# Patient Record
Sex: Female | Born: 1985
Health system: Southern US, Community
[De-identification: ages and names within clinical notes are randomized; demographics above are authoritative.]

---

## 2006-07-22 ENCOUNTER — Ambulatory Visit (HOSPITAL_COMMUNITY): Admission: RE | Admit: 2006-07-22 | Discharge: 2006-07-22 | Payer: Self-pay | Admitting: Obstetrics

## 2013-09-10 ENCOUNTER — Ambulatory Visit (INDEPENDENT_AMBULATORY_CARE_PROVIDER_SITE_OTHER): Payer: BC Managed Care – HMO | Admitting: Family Medicine

## 2013-09-10 VITALS — BP 118/82 | HR 80 | Temp 98.9°F | Resp 20 | Ht 63.5 in | Wt 236.6 lb

## 2013-09-10 DIAGNOSIS — Z111 Encounter for screening for respiratory tuberculosis: Secondary | ICD-10-CM

## 2013-09-10 DIAGNOSIS — Z0000 Encounter for general adult medical examination without abnormal findings: Secondary | ICD-10-CM

## 2013-09-10 DIAGNOSIS — H40059 Ocular hypertension, unspecified eye: Secondary | ICD-10-CM

## 2013-09-10 DIAGNOSIS — E669 Obesity, unspecified: Secondary | ICD-10-CM

## 2013-09-10 DIAGNOSIS — Z23 Encounter for immunization: Secondary | ICD-10-CM

## 2013-09-10 NOTE — Progress Notes (Signed)
[facility] [address], [place] [address] [phone]  Date:  09/10/2013   Name:  Bridget Thomas   DOB:  04-14-1986   MRN:  [id number]  PCP:  No primary provider on file.    Chief Complaint: Annual Exam and PPD testing   History of Present Illness:  Bridget Thomas is a 27 y.o. very pleasant female patient who presents with the following:  She needs a physical today for school.  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  However, she is due for a tetanus shot.  She does not want a flu shot, pap or any labs today Never had a positive PPD  She does use eye drops for increased intraocular pressure- she does not have glaucoma.   LMP less than a month ago  There are no active problems to display for this patient.   History reviewed. No pertinent past medical history.  History reviewed. No pertinent past surgical history.  History  Substance Use Topics  . Smoking status: Never Smoker   . Smokeless tobacco: Not on file  . Alcohol Use: 0.5 oz/week    1 drink(s) per week    Family History  Problem Relation Age of Onset  . Hypertension Mother   . Hypertension Father   . Heart disease Maternal Grandfather   . Heart disease Paternal Grandfather     No Known Allergies  Medication list has been reviewed and updated.  No current outpatient prescriptions on file prior to visit.   No current facility-administered medications on file prior to visit.    Review of Systems:  As per HPI- otherwise negative.   Physical Examination: Filed Vitals:   09/10/13 1057  BP: 118/82  Pulse: 80  Temp: 98.9 F (37.2 C)  Resp: 20   Filed Vitals:   09/10/13 1057  Height: 5' 3.5" (1.613 m)  Weight: 236 lb 9.6 oz (107.321 kg)   Body mass index is 41.25 kg/(m^2). Ideal Body Weight: Weight in (lb) to have BMI = 25: 143.1   GEN: WDWN, NAD, Non-toxic, A & O x 3,  obese HEENT: Atraumatic, Normocephalic. Neck supple. No masses, No LAD. Ears and Nose: No external deformity. CV: RRR, No M/G/R. No JVD. No thrill. No extra heart sounds. PULM: CTA B, no wheezes, crackles, rhonchi. No retractions. No resp. distress. No accessory muscle use. ABD: S, NT, ND. No rebound. No HSM. EXTR: No c/c/e NEURO Normal gait. Normal strength and ROM of all extremities  PSYCH: Normally interactive. Conversant. Not depressed or anxious appearing.  Calm demeanor.   Assessment and Plan: Physical exam - Plan: Tdap vaccine greater than or equal to 7yo IM  Screening-pulmonary TB - Plan: TB Skin Test  PE for work today.  Tdap, PPD.  declined other screening services at this time.  Recommended a flu shot  Signed Abbe Amsterdam, MD

## 2013-09-10 NOTE — Patient Instructions (Signed)
Your tetanus shot is good for 10 years.  You received the TDAP today Please come back for your TB test reading as directed

## 2013-09-12 ENCOUNTER — Encounter (INDEPENDENT_AMBULATORY_CARE_PROVIDER_SITE_OTHER): Payer: BC Managed Care – HMO

## 2013-09-12 DIAGNOSIS — Z111 Encounter for screening for respiratory tuberculosis: Secondary | ICD-10-CM

## 2013-11-13 ENCOUNTER — Ambulatory Visit: Payer: BC Managed Care – HMO | Admitting: Emergency Medicine

## 2013-11-13 ENCOUNTER — Ambulatory Visit: Payer: BC Managed Care – HMO

## 2013-11-13 VITALS — BP 124/66 | HR 103 | Temp 99.1°F | Resp 18 | Ht 63.5 in | Wt 239.6 lb

## 2013-11-13 DIAGNOSIS — R05 Cough: Secondary | ICD-10-CM

## 2013-11-13 DIAGNOSIS — R059 Cough, unspecified: Secondary | ICD-10-CM

## 2013-11-13 DIAGNOSIS — J209 Acute bronchitis, unspecified: Secondary | ICD-10-CM

## 2013-11-13 LAB — POCT CBC
Granulocyte percent: 57.8 %G (ref 37–80)
Hemoglobin: 12.8 g/dL (ref 12.2–16.2)
MCH, POC: 30.6 pg (ref 27–31.2)
MID (cbc): 0.6 (ref 0–0.9)
MPV: 7.6 fL (ref 0–99.8)
POC MID %: 7.9 %M (ref 0–12)
Platelet Count, POC: 387 10*3/uL (ref 142–424)
RBC: 4.18 M/uL (ref 4.04–5.48)
WBC: 8 10*3/uL (ref 4.6–10.2)

## 2013-11-13 MED ORDER — AZITHROMYCIN 250 MG PO TABS: ORAL_TABLET | ORAL | Status: DC

## 2013-11-13 MED ORDER — HYDROCOD POLST-CHLORPHEN POLST 10-8 MG/5ML PO LQCR: 5.0000 mL | Freq: Two times a day (BID) | ORAL | Status: DC | PRN

## 2013-11-13 NOTE — Patient Instructions (Signed)

## 2013-11-13 NOTE — Progress Notes (Signed)
[facility] [address] [place] [address] [phone]  Date:  11/13/2013   Name:  Bridget Thomas   DOB:  Mar 10, 1986   MRN:  [id number]  PCP:  No primary provider on file.    Chief Complaint: Cough and Abdominal Pain   History of Present Illness:  Bridget Thomas is a 27 y.o. very pleasant female patient who presents with the following:  Ill two weeks with upper respiratory infection.  Has nasal congestion and watery drainage.  Worsened.  Now has cough that is largely not productive, mucopurulent nasal drainage, scratchy throat.  Nauseated and vomited once today.  Feverish no documented fever.  Now chilled.  No headache or rash.  No wheezing or shortness of breath.  No stool change.  No improvement with over the counter medications or other home remedies. Denies other complaint or health concern today.   Patient Active Problem List   Diagnosis Date Noted  . Obesity, unspecified 09/10/2013  . Increased intraocular pressure 09/10/2013    History reviewed. No pertinent past medical history.  History reviewed. No pertinent past surgical history.  History  Substance Use Topics  . Smoking status: Never Smoker   . Smokeless tobacco: Not on file  . Alcohol Use: 0.5 oz/week    1 drink(s) per week    Family History  Problem Relation Age of Onset  . Hypertension Mother   . Hypertension Father   . Heart disease Maternal Grandfather   . Heart disease Paternal Grandfather     No Known Allergies  Medication list has been reviewed and updated.  Current Outpatient Prescriptions on File Prior to Visit  Medication Sig Dispense Refill  . APAP-Isometheptene-Dichloral (ISO-ACETAZONE PO) Place 5 % into the left eye.       No current facility-administered medications on file prior to visit.    Review of Systems:  As per HPI, otherwise negative.    Physical Examination: Filed Vitals:   11/13/13 1427  BP: 124/66  Pulse: 103  Temp: 99.1 F (37.3  C)  Resp: 18   Filed Vitals:   11/13/13 1427  Height: 5' 3.5" (1.613 m)  Weight: 239 lb 9.6 oz (108.682 kg)   Body mass index is 41.77 kg/(m^2). Ideal Body Weight: Weight in (lb) to have BMI = 25: 143.1  GEN: obese, NAD, Non-toxic, A & O x 3 HEENT: Atraumatic, Normocephalic. Neck supple. No masses, No LAD. Ears and Nose: No external deformity. CV: RRR, No M/G/R. No JVD. No thrill. No extra heart sounds. PULM: CTA B, no wheezes, crackles, rhonchi. No retractions. No resp. distress. No accessory muscle use. ABD: S, NT, ND, +BS. No rebound. No HSM. EXTR: No c/c/e NEURO Normal gait.  PSYCH: Normally interactive. Conversant. Not depressed or anxious appearing.  Calm demeanor.    Assessment and Plan: Bronchitis zpak tussionex  Signed,  Phillips Odor, MD  [facility] reading (PRIMARY) by  Dr. Dareen Piano.  Negative chest.    Results for orders placed in visit on 11/13/13  POCT CBC      Result Value Range   WBC 8.0  4.6 - 10.2 K/uL   Lymph, poc 2.7  0.6 - 3.4   POC LYMPH PERCENT 34.3  10 - 50 %L   MID (cbc) 0.6  0 - 0.9   POC MID % 7.9  0 - 12 %M   POC Granulocyte 4.6  2 - 6.9   Granulocyte percent 57.8  37 - 80 %G   RBC 4.18  4.04 - 5.48 M/uL   Hemoglobin 12.8  12.2 - 16.2 g/dL   HCT, POC 21.3  08.6 - 47.9 %   MCV 97.7 (*) 80 - 97 fL   MCH, POC 30.6  27 - 31.2 pg   MCHC 31.4 (*) 31.8 - 35.4 g/dL   RDW, POC 57.8     Platelet Count, POC 387  142 - 424 K/uL   MPV 7.6  0 - 99.8 fL

## 2013-11-15 ENCOUNTER — Telehealth: Payer: Self-pay

## 2013-11-15 NOTE — Telephone Encounter (Signed)
Patient called and is wanting to know if she can get a note for being out of work.  She is still really sick and has not been able to go back to work yet. Please call her.

## 2013-11-16 ENCOUNTER — Telehealth: Payer: Self-pay

## 2013-11-16 NOTE — Telephone Encounter (Signed)
Done and in drawer. Pt aware.

## 2013-11-16 NOTE — Telephone Encounter (Signed)
Called pt and she stated that she is improving since her OV, but she hasn't been able to keep food down very well and is still feeling very weak. She reported that she has been able to keep Gatorade down and has been drinking that in order to remain hydrated. Pt requests OOW note from Tues afternoon through Friday (today), to go back to work tomorrow. Dr Dareen Piano, is this OK?

## 2013-11-16 NOTE — Telephone Encounter (Signed)
Fine

## 2013-11-30 NOTE — Telephone Encounter (Signed)
No message

## 2015-03-03 ENCOUNTER — Emergency Department (INDEPENDENT_AMBULATORY_CARE_PROVIDER_SITE_OTHER)
Admission: EM | Admit: 2015-03-03 | Discharge: 2015-03-03 | Disposition: A | Discharge status: Home / Self Care | Payer: BLUE CROSS/BLUE SHIELD | Source: Home / Self Care | Attending: Emergency Medicine | Admitting: Emergency Medicine

## 2015-03-03 ENCOUNTER — Encounter (HOSPITAL_COMMUNITY): Payer: Self-pay | Admitting: Emergency Medicine

## 2015-03-03 DIAGNOSIS — J069 Acute upper respiratory infection, unspecified: Secondary | ICD-10-CM

## 2015-03-03 LAB — POCT RAPID STREP A: STREPTOCOCCUS, GROUP A SCREEN (DIRECT): NEGATIVE

## 2015-03-03 MED ORDER — IPRATROPIUM BROMIDE 0.06 % NA SOLN: 2.0000 | Freq: Four times a day (QID) | NASAL | Status: DC

## 2015-03-03 MED ORDER — NAPROXEN 500 MG PO TABS: 500.0000 mg | ORAL_TABLET | Freq: Two times a day (BID) | ORAL | Status: DC

## 2015-03-03 MED ORDER — BENZONATATE 200 MG PO CAPS: 200.0000 mg | ORAL_CAPSULE | Freq: Three times a day (TID) | ORAL | Status: DC | PRN

## 2015-03-03 NOTE — Discharge Instructions (Signed)

## 2015-03-03 NOTE — ED Notes (Signed)
See provider's note

## 2015-03-03 NOTE — ED Provider Notes (Signed)
   Chief Complaint   Sore Throat   History of Present Illness   Bridget Thomas is a 29 year old female who has had a three-day history of sore throat, burning throat, nasal congestion with clear to yellow, bloody rhinorrhea, headache, sinus pressure, ear congestion, dry cough, chills, and fatigue. She denies any fever or chills. She works as a Lawyersubstitute teacher.  Review of Systems   Other than as noted above, the patient denies any of the following symptoms: Systemic:  No fevers, chills, sweats, or myalgias. Eye:  No redness or discharge. ENT:  No ear pain, headache, nasal congestion, drainage, sinus pressure, or sore throat. Neck:  No neck pain, stiffness, or swollen glands. Lungs:  No cough, sputum production, hemoptysis, wheezing, chest tightness, shortness of breath or chest pain. GI:  No abdominal pain, nausea, vomiting or diarrhea.  PMFSH   Past medical history, family history, social history, meds, and allergies were reviewed. She takes eyedrops for glaucoma.  Physical exam   Vital signs:  BP 132/88 mmHg  Pulse 82  Temp(Src) 99 F (37.2 C) (Oral)  Resp 16  SpO2 98% General:  Alert and oriented.  In no distress.  Skin warm and dry. Eye:  No conjunctival injection or drainage. Lids were normal. ENT:  TMs and canals were normal, without erythema or inflammation.  Nasal mucosa was clear and uncongested, without drainage.  Mucous membranes were moist.  Pharynx was clear with no exudate or drainage.  There were no oral ulcerations or lesions. Neck:  Supple, no adenopathy, tenderness or mass. Lungs:  No respiratory distress.  Lungs were clear to auscultation, without wheezes, rales or rhonchi.  Breath sounds were clear and equal bilaterally.  Heart:  Regular rhythm, without gallops, murmers or rubs. Skin:  Clear, warm, and dry, without rash or lesions.  Labs   Results for orders placed or performed during the hospital encounter of 03/03/15  POCT rapid strep A [facility]  Result Value Ref Range   Streptococcus, Group A Screen (Direct) NEGATIVE NEGATIVE    Assessment     The encounter diagnosis was Viral URI.  There is no evidence of pneumonia, strep throat, sinusitis, otitis media.    Plan    1.  Meds:  The following meds were prescribed:   New Prescriptions   BENZONATATE (TESSALON) 200 MG CAPSULE    Take 1 capsule (200 mg total) by mouth 3 (three) times daily as needed for cough.   IPRATROPIUM (ATROVENT) 0.06 % NASAL SPRAY    Place 2 sprays into both nostrils 4 (four) times daily.   NAPROXEN (NAPROSYN) 500 MG TABLET    Take 1 tablet (500 mg total) by mouth 2 (two) times daily.    2.  Patient Education/Counseling:  The patient was given appropriate handouts, self care instructions, and instructed in symptomatic relief.  Instructed to get extra fluids and extra rest.    3.  Follow up:  The patient was told to follow up here if no better in 3 to 4 days, or sooner if becoming worse in any way, and given some red flag symptoms such as increasing fever, difficulty breathing, chest pain, or persistent vomiting which would prompt immediate return.       Reuben Likesavid C Lyman Balingit, MD 03/03/15 [phone]

## 2015-03-06 LAB — CULTURE, GROUP A STREP: STREP A CULTURE: NEGATIVE

## 2016-06-22 DIAGNOSIS — R102 Pelvic and perineal pain: Secondary | ICD-10-CM | POA: Diagnosis not present

## 2016-06-22 DIAGNOSIS — N944 Primary dysmenorrhea: Secondary | ICD-10-CM | POA: Diagnosis not present

## 2016-07-13 ENCOUNTER — Ambulatory Visit (INDEPENDENT_AMBULATORY_CARE_PROVIDER_SITE_OTHER): Payer: BLUE CROSS/BLUE SHIELD | Admitting: Physician Assistant

## 2016-07-13 VITALS — BP 122/72 | HR 83 | Temp 98.8°F | Resp 17 | Ht 63.5 in | Wt 238.0 lb

## 2016-07-13 DIAGNOSIS — J069 Acute upper respiratory infection, unspecified: Secondary | ICD-10-CM | POA: Diagnosis not present

## 2016-07-13 NOTE — Patient Instructions (Signed)
Take Zytec-D, one in the morning and one at night.  Take Afrin Nasal spray in the evening only for no more than three days. Please take today and tomorrow so you can rest.

## 2016-07-13 NOTE — Progress Notes (Signed)
   07/13/2016 12:41 PM   DOB: 05-07-1986 / MRN: [id number]  SUBJECTIVE:  Bridget Thomas is a 30 y.o. female presenting for nasal congestion.  This was preceded by a sore throat that started 4 days ago and resolved on its own.  She denies cough, fever, chills, nausea, SOB, DOE.  She is no worse or better with the exception of the sore throat. She has been taking tylenol. She would like a note for work.   She has No Known Allergies.   She  has no past medical history on file.    She  reports that she has never smoked. She does not have any smokeless tobacco history on file. She reports that she drinks about 0.5 oz of alcohol per week. She reports that she does not use illicit drugs. She  has no sexual activity history on file. The patient  has no past surgical history on file.  Her family history includes Heart disease in her maternal grandfather and paternal grandfather; Hypertension in her father and mother.  Review of Systems  Constitutional: Negative for fever and chills.  Respiratory: Negative for cough.   Cardiovascular: Negative for chest pain.  Musculoskeletal: Negative for myalgias.  Neurological: Negative for dizziness and headaches.    Problem list and medications reviewed and updated by myself where necessary, and exist elsewhere in the encounter.   OBJECTIVE:  BP 122/72 mmHg  Pulse 83  Temp(Src) 98.8 F (37.1 C) (Oral)  Resp 17  Ht 5' 3.5" (1.613 m)  Wt 238 lb (107.956 kg)  BMI 41.49 kg/m2  SpO2 98%  LMP 06/15/2016  Physical Exam  Constitutional: She is oriented to person, place, and time.  HENT:  Right Ear: External ear normal.  Left Ear: External ear normal.  Nose: Mucosal edema present. Right sinus exhibits no maxillary sinus tenderness and no frontal sinus tenderness. Left sinus exhibits no maxillary sinus tenderness and no frontal sinus tenderness.  Mouth/Throat: Oropharynx is clear and moist. No oropharyngeal exudate.  Eyes: Conjunctivae are normal. Pupils  are equal, round, and reactive to light.  Cardiovascular: Regular rhythm and normal heart sounds.   Pulmonary/Chest: Effort normal and breath sounds normal. She has no rales.  Neurological: She is alert and oriented to person, place, and time.  Skin: Skin is warm and dry. No rash noted. She is not diaphoretic. No erythema.  Psychiatric: Her behavior is normal.    No results found for this or any previous visit (from the past 72 hour(s)).  No results found.  ASSESSMENT AND PLAN  [name] was seen today for sinusitis and nasal congestion.  Diagnoses and all orders for this visit:  Viral URI: Patient reassured.  Zyrtec-D and afrin for three days at night only.     The patient was advised to call or return to clinic if she does not see an improvement in symptoms, or to seek the care of the closest emergency department if she worsens with the above plan.   Deliah BostonMichael Sherelle Castelli, MHS, PA-C [facility] 07/13/2016 12:41 PM

## 2016-07-26 DIAGNOSIS — H401131 Primary open-angle glaucoma, bilateral, mild stage: Secondary | ICD-10-CM | POA: Diagnosis not present

## 2016-09-15 DIAGNOSIS — N944 Primary dysmenorrhea: Secondary | ICD-10-CM | POA: Diagnosis not present

## 2016-09-15 DIAGNOSIS — Z1151 Encounter for screening for human papillomavirus (HPV): Secondary | ICD-10-CM | POA: Diagnosis not present

## 2016-09-15 DIAGNOSIS — Z130 Encounter for screening for diseases of the blood and blood-forming organs and certain disorders involving the immune mechanism: Secondary | ICD-10-CM | POA: Diagnosis not present

## 2016-09-15 DIAGNOSIS — Z13220 Encounter for screening for lipoid disorders: Secondary | ICD-10-CM | POA: Diagnosis not present

## 2016-09-15 DIAGNOSIS — N898 Other specified noninflammatory disorders of vagina: Secondary | ICD-10-CM | POA: Diagnosis not present

## 2016-09-15 DIAGNOSIS — Z01419 Encounter for gynecological examination (general) (routine) without abnormal findings: Secondary | ICD-10-CM | POA: Diagnosis not present

## 2016-09-15 DIAGNOSIS — Z1329 Encounter for screening for other suspected endocrine disorder: Secondary | ICD-10-CM | POA: Diagnosis not present

## 2016-09-15 DIAGNOSIS — Z0000 Encounter for general adult medical examination without abnormal findings: Secondary | ICD-10-CM | POA: Diagnosis not present

## 2016-09-15 DIAGNOSIS — R87810 Cervical high risk human papillomavirus (HPV) DNA test positive: Secondary | ICD-10-CM | POA: Diagnosis not present

## 2016-09-15 DIAGNOSIS — Z131 Encounter for screening for diabetes mellitus: Secondary | ICD-10-CM | POA: Diagnosis not present

## 2016-09-17 DIAGNOSIS — L304 Erythema intertrigo: Secondary | ICD-10-CM | POA: Diagnosis not present

## 2016-09-17 DIAGNOSIS — L309 Dermatitis, unspecified: Secondary | ICD-10-CM | POA: Diagnosis not present

## 2016-09-17 DIAGNOSIS — L906 Striae atrophicae: Secondary | ICD-10-CM | POA: Diagnosis not present

## 2016-12-09 DIAGNOSIS — E559 Vitamin D deficiency, unspecified: Secondary | ICD-10-CM | POA: Diagnosis not present

## 2017-01-21 ENCOUNTER — Ambulatory Visit (INDEPENDENT_AMBULATORY_CARE_PROVIDER_SITE_OTHER): Payer: BLUE CROSS/BLUE SHIELD | Admitting: Physician Assistant

## 2017-01-21 VITALS — BP 120/76 | HR 76 | Temp 98.7°F | Resp 18 | Ht 63.5 in | Wt 245.0 lb

## 2017-01-21 DIAGNOSIS — J3489 Other specified disorders of nose and nasal sinuses: Secondary | ICD-10-CM | POA: Diagnosis not present

## 2017-01-21 DIAGNOSIS — R6889 Other general symptoms and signs: Secondary | ICD-10-CM | POA: Diagnosis not present

## 2017-01-21 MED ORDER — IPRATROPIUM BROMIDE 0.03 % NA SOLN: 2.0000 | Freq: Two times a day (BID) | NASAL | 0 refills | Status: DC

## 2017-01-21 MED ORDER — OSELTAMIVIR PHOSPHATE 75 MG PO CAPS: 75.0000 mg | ORAL_CAPSULE | Freq: Two times a day (BID) | ORAL | 0 refills | Status: DC

## 2017-01-21 NOTE — Progress Notes (Signed)
[facility] [address], [place] [id number] [phone]  Date:  01/21/2017   Name:  Bridget Thomas   DOB:  06/04/1986   MRN:  [id number]  PCP:  Duane LopeAlan Ross, MD    History of Present Illness:  Bridget Thomas is a 31 y.o. female patient who [facility] to [facility] cc of nasal congestion, cp, and facial pain.    Chief Complaint  Patient presents with  . Nasal Congestion  . Chest Pain    TIGHTNESS AND CONGESTION   . Nasal Congestion    RUNNY NOSE  . Facial Pain    TIGHTNESS   2 days ago, in the evening, she started to have burning throat pain.  She has congestion and headache.  Chills.  Tight in the face.  Ears are popping.  Runny nose.  Minimal coughing.  Chest feels tight.  No sob or dyspnea.  She feels very fatigued.  Yellow and white mucus from the nose when she blows.  Subjective fever and chills. Frontal sinus and cheeks feel very "tight".   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 She has done mucinex and Theraflu--they did not help much.   No flu vaccine this year.   Chest feels tight.  Coughing has started but minimal.     Patient Active Problem List   Diagnosis Date Noted  . Obesity, unspecified 09/10/2013  . Increased intraocular pressure 09/10/2013    History reviewed. No pertinent past medical history.  History reviewed. No pertinent surgical history.  Social History  Substance Use Topics  . Smoking status: Never Smoker  . Smokeless tobacco: Never Used  . Alcohol use 0.5 oz/week    1 Standard drinks or equivalent per week    Family History  Problem Relation Age of Onset  . Hypertension Mother   . Hypertension Father   . Heart disease Maternal Grandfather   . Heart disease Paternal Grandfather     No Known Allergies  Medication list has been reviewed and updated.  Current Outpatient Prescriptions on File Prior to Visit  Medication Sig Dispense Refill  . acetaminophen  (TYLENOL) 325 MG tablet Take 650 mg by mouth every 6 (six) hours as needed.    Marland Kitchen. APAP-Isometheptene-Dichloral (ISO-ACETAZONE PO) Place 5 % into the left eye.    . cetirizine (ZYRTEC) 10 MG tablet Take 10 mg by mouth daily.     No current facility-administered medications on file prior to visit.     ROS ROS otherwise unremarkable unless listed above.   Physical Examination: BP 120/76 (BP Location: Right Arm, Patient Position: Sitting, Cuff Size: Small)   Pulse 76   Temp 98.7 F (37.1 C) (Oral)   Resp 18   Ht 5' 3.5" (1.613 m)   Wt 245 lb (111.1 kg)   LMP 01/13/2017 (Exact Date)   SpO2 99%   BMI 42.72 kg/m  Ideal Body Weight: Weight in (lb) to have BMI = 25: 143.1  Physical Exam  Constitutional: She is oriented to person, place, and time. She appears well-developed and well-nourished. No distress.  HENT:  Head: Normocephalic and atraumatic.  Right Ear: Tympanic membrane, external ear and ear canal normal.  Left Ear: Tympanic membrane, external ear and ear canal normal.  Nose: Mucosal edema and rhinorrhea present. Right sinus exhibits maxillary sinus tenderness and frontal sinus tenderness. Left sinus exhibits maxillary sinus tenderness and frontal sinus tenderness.  Mouth/Throat: No uvula  swelling. No oropharyngeal exudate, posterior oropharyngeal edema or posterior oropharyngeal erythema.  Eyes: Conjunctivae and EOM are normal. Pupils are equal, round, and reactive to light.  Cardiovascular: Normal rate and regular rhythm.  Exam reveals no gallop, no distant heart sounds and no friction rub.   No murmur heard. Pulmonary/Chest: Effort normal. No respiratory distress. She has no decreased breath sounds. She has no wheezes. She has no rhonchi.  Lymphadenopathy:       Head (right side): No submandibular, no tonsillar, no preauricular and no posterior auricular adenopathy present.       Head (left side): No submandibular, no tonsillar, no preauricular and no posterior auricular  adenopathy present.  Neurological: She is alert and oriented to person, place, and time.  Skin: She is not diaphoretic.  Psychiatric: She has a normal mood and affect. Her behavior is normal.     Assessment and Plan: Bridget Thomas is a 31 y.o. female who is here today for cc of sinus pain, congestion, tightness, and fever.  Fine to write note, for patient if she needs for Monday.  Possible flu, possible rhinosinusitis.  We will start treatment for possible flu.  Advised that this may not resolve a viral sinus infection.  She would like to treat for possible flu.  Advised tylenol or ibuprofen for pain.    Flu-like symptoms  Sinus pain - Plan: oseltamivir (TAMIFLU) 75 MG capsule, ipratropium (ATROVENT) 0.03 % nasal spray  Trena Platt, PA-C [facility] 1/28/20187:34 PM

## 2017-01-21 NOTE — Patient Instructions (Addendum)
Continue to take the mucinex.  Make sure you are drinking at least 64 oz of water per day. Use tylenol or ibuprofen for pain and fever.  You can take 600mg  of ibuprofen every 6 hours. Take with food.   Take medications as prescribed.   IF you received an x-ray today, you will receive an invoice from [facility]. Please contact [facility] at [phone] with questions or concerns regarding your invoice.   IF you received labwork today, you will receive an invoice from [facility]. Please contact [facility] at [phone] with questions or concerns regarding your invoice.   Our billing staff will not be able to assist you with questions regarding bills from these companies.  You will be contacted with the lab results as soon as they are available. The fastest way to get your results is to activate your My Chart account. Instructions are located on the last page of this paperwork. If you have not heard from us regarding the results in 2 weeks, please contact this office.

## 2017-01-22 ENCOUNTER — Telehealth: Payer: Self-pay

## 2017-01-22 NOTE — Telephone Encounter (Signed)
That is fine to write a note.  If she does not feel improvement by Tuesday, then she can return or call as long as she has no trouble breathing, sob, or dizziness.

## 2017-01-22 NOTE — Telephone Encounter (Signed)
Pt saw stephanie on Friday and was told if she needed a note for Monday to let us know -pt states she should be a note in chart so patient is needing the note being out Monday and return on Tuesday and would like to know if she does not feel better should she come back in   Best number [phone]

## 2017-01-22 NOTE — Telephone Encounter (Signed)
Okay to provide work note for Monday?

## 2017-01-24 ENCOUNTER — Ambulatory Visit (INDEPENDENT_AMBULATORY_CARE_PROVIDER_SITE_OTHER): Payer: BLUE CROSS/BLUE SHIELD | Admitting: Family Medicine

## 2017-01-24 VITALS — BP 118/82 | HR 98 | Temp 98.5°F | Resp 18 | Ht 63.5 in | Wt 244.0 lb

## 2017-01-24 DIAGNOSIS — J069 Acute upper respiratory infection, unspecified: Secondary | ICD-10-CM | POA: Diagnosis not present

## 2017-01-24 DIAGNOSIS — J9801 Acute bronchospasm: Secondary | ICD-10-CM

## 2017-01-24 MED ORDER — ALBUTEROL SULFATE HFA 108 (90 BASE) MCG/ACT IN AERS: 2.0000 | INHALATION_SPRAY | RESPIRATORY_TRACT | 0 refills | Status: DC | PRN

## 2017-01-24 MED ORDER — HYDROCODONE-HOMATROPINE 5-1.5 MG/5ML PO SYRP: 5.0000 mL | ORAL_SOLUTION | Freq: Three times a day (TID) | ORAL | 0 refills | Status: DC | PRN

## 2017-01-24 NOTE — Telephone Encounter (Signed)
Letter printed.

## 2017-01-24 NOTE — Progress Notes (Signed)
  Chief Complaint  Patient presents with  . Cough  . Chest Pain    Congestion   . Follow-up    recheck sinus infection     HPI  Onset of symptoms 14 ago with progressive symptoms that include there isnt history of asthma There are no sick contacts  Pt reports that she completed the course of tamiflu She reports that she continues to have a dry raspy cough She denies fevers or chills She reports that she coughs to the point of fatigue She works as a Education officer, environmentalpersonal banker No diarrhea or vomiting  History reviewed. No pertinent past medical history.  Current Outpatient Prescriptions  Medication Sig Dispense Refill  . acetaminophen (TYLENOL) 325 MG tablet Take 650 mg by mouth every 6 (six) hours as needed.    Marland Kitchen. APAP-Isometheptene-Dichloral (ISO-ACETAZONE PO) Place 5 % into the left eye.    . cetirizine (ZYRTEC) 10 MG tablet Take 10 mg by mouth daily.    Marland Kitchen. ipratropium (ATROVENT) 0.03 % nasal spray Place 2 sprays into both nostrils 2 (two) times daily. 30 mL 0  . oseltamivir (TAMIFLU) 75 MG capsule Take 1 capsule (75 mg total) by mouth 2 (two) times daily. 10 capsule 0  . albuterol (PROVENTIL HFA;VENTOLIN HFA) 108 (90 Base) MCG/ACT inhaler Inhale 2 puffs into the lungs every 4 (four) hours as needed (cough). 1 Inhaler 0  . HYDROcodone-homatropine (HYCODAN) 5-1.5 MG/5ML syrup Take 5 mLs by mouth every 8 (eight) hours as needed for cough. 120 mL 0   No current facility-administered medications for this visit.     Allergies: No Known Allergies  History reviewed. No pertinent surgical history.  Social History   Social History  . Marital status: Married    Spouse name: N/A  . Number of children: N/A  . Years of education: N/A   Social History Main Topics  . Smoking status: Never Smoker  . Smokeless tobacco: Never Used  . Alcohol use 0.5 oz/week    1 Standard drinks or equivalent per week  . Drug use: No  . Sexual activity: Not Asked   Other Topics Concern  . None   Social  History Narrative  . None    ROS See hpi  Objective: Vitals:   01/24/17 1338  BP: 118/82  Pulse: 98  Resp: 18  Temp: 98.5 F (36.9 C)  TempSrc: Oral  SpO2: 97%  Weight: 244 lb (110.7 kg)  Height: 5' 3.5" (1.613 m)    Physical Exam General: alert, oriented, in NAD Head: normocephalic, atraumatic, no sinus tenderness Eyes: EOM intact, no scleral icterus or conjunctival injection Ears: TM clear bilaterally Throat: no pharyngeal exudate or erythema Lymph: no posterior auricular, submental or cervical lymph adenopathy Heart: normal rate, normal sinus rhythm, no murmurs Lungs: clear to auscultation bilaterally, no wheezing   Assessment and Plan Shantoya was seen today for cough, chest pain and follow-up.  Diagnoses and all orders for this visit:  Acute URI Bronchospasm Other orders Given that pt received Tamiflu Prescribed albuterol for bronchospasm Discussed use of albuterol for the day time cough and hycodan for cough  -     albuterol (PROVENTIL HFA;VENTOLIN HFA) 108 (90 Base) MCG/ACT inhaler; Inhale 2 puffs into the lungs every 4 (four) hours as needed (cough). -     HYDROcodone-homatropine (HYCODAN) 5-1.5 MG/5ML syrup; Take 5 mLs by mouth every 8 (eight) hours as needed for cough.     Cinsere Mizrahi A Earlyn Sylvan

## 2017-01-24 NOTE — Patient Instructions (Addendum)
Bronchospasm, Adult A bronchospasm is a spasm or tightening of the airways going into the lungs. During a bronchospasm breathing becomes more difficult because the airways get smaller. When this happens there can be coughing, a whistling sound when breathing (wheezing), and difficulty breathing. Bronchospasm is often associated with asthma, but not all patients who experience a bronchospasm have asthma. What are the causes? A bronchospasm is caused by inflammation or irritation of the airways. The inflammation or irritation may be triggered by:  Allergies (such as to animals, pollen, food, or mold). Allergens that cause bronchospasm may cause wheezing immediately after exposure or many hours later.  Infection. Viral infections are believed to be the most common cause of bronchospasm.  Exercise.  Irritants (such as pollution, cigarette smoke, strong odors, aerosol sprays, and paint fumes).  Weather changes. Winds increase molds and pollens in the air. Rain refreshes the air by washing irritants out. Cold air may cause inflammation.  Stress and emotional upset. What are the signs or symptoms?  Wheezing.  Excessive nighttime coughing.  Frequent or severe coughing with a simple cold.  Chest tightness.  Shortness of breath. How is this diagnosed? Bronchospasm is usually diagnosed through a history and physical exam. Tests, such as chest X-rays, are sometimes done to look for other conditions. How is this treated?  Inhaled medicines can be given to open up your airways and help you breathe. The medicines can be given using either an inhaler or a nebulizer machine.  Corticosteroid medicines may be given for severe bronchospasm, usually when it is associated with asthma. Follow these instructions at home:  Always have a plan prepared for seeking medical care. Know when to call your health care provider and local emergency services (911 in the [place] Know where you can access local  emergency care.  Only take medicines as directed by your health care provider.  If you were prescribed an inhaler or nebulizer machine, ask your health care provider to explain how to use it correctly. Always use a spacer with your inhaler if you were given one.  It is necessary to remain calm during an attack. Try to relax and breathe more slowly.  Control your home environment in the following ways:  Change your heating and air conditioning filter at least once a month.  Limit your use of fireplaces and wood stoves.  Do not smoke and do not allow smoking in your home.  Avoid exposure to perfumes and fragrances.  Get rid of pests (such as roaches and mice) and their droppings.  Throw away plants if you see mold on them.  Keep your house clean and dust free.  Replace carpet with wood, tile, or vinyl flooring. Carpet can trap dander and dust.  Use allergy-proof pillows, mattress covers, and box spring covers.  Wash bed sheets and blankets every week in hot water and dry them in a dryer.  Use blankets that are made of polyester or cotton.  Wash hands frequently. Contact a health care provider if:  You have muscle aches.  You have chest pain.  The sputum changes from clear or white to yellow, green, gray, or bloody.  The sputum you cough up gets thicker.  There are problems that may be related to the medicine you are given, such as a rash, itching, swelling, or trouble breathing. Get help right away if:  You have worsening wheezing and coughing even after taking your prescribed medicines.  You have increased difficulty breathing.  You develop severe chest pain. This   information is not intended to replace advice given to you by your health care provider. Make sure you discuss any questions you have with your health care provider. Document Released: 12/16/2003 Document Revised: 05/20/2016 Document Reviewed: 06/04/2013 Elsevier Interactive Patient Education  2017  Elsevier Inc.  

## 2017-04-04 DIAGNOSIS — H401131 Primary open-angle glaucoma, bilateral, mild stage: Secondary | ICD-10-CM | POA: Diagnosis not present

## 2017-08-24 DIAGNOSIS — H401131 Primary open-angle glaucoma, bilateral, mild stage: Secondary | ICD-10-CM | POA: Diagnosis not present

## 2017-09-21 DIAGNOSIS — Z114 Encounter for screening for human immunodeficiency virus [HIV]: Secondary | ICD-10-CM | POA: Diagnosis not present

## 2017-09-21 DIAGNOSIS — Z1151 Encounter for screening for human papillomavirus (HPV): Secondary | ICD-10-CM | POA: Diagnosis not present

## 2017-09-21 DIAGNOSIS — Z6841 Body Mass Index (BMI) 40.0 and over, adult: Secondary | ICD-10-CM | POA: Diagnosis not present

## 2017-09-21 DIAGNOSIS — Z113 Encounter for screening for infections with a predominantly sexual mode of transmission: Secondary | ICD-10-CM | POA: Diagnosis not present

## 2017-09-21 DIAGNOSIS — Z01419 Encounter for gynecological examination (general) (routine) without abnormal findings: Secondary | ICD-10-CM | POA: Diagnosis not present

## 2017-09-21 DIAGNOSIS — Z1159 Encounter for screening for other viral diseases: Secondary | ICD-10-CM | POA: Diagnosis not present

## 2017-09-21 DIAGNOSIS — R87810 Cervical high risk human papillomavirus (HPV) DNA test positive: Secondary | ICD-10-CM | POA: Diagnosis not present

## 2017-09-23 ENCOUNTER — Encounter: Payer: Self-pay | Admitting: Family Medicine

## 2017-09-23 ENCOUNTER — Ambulatory Visit (INDEPENDENT_AMBULATORY_CARE_PROVIDER_SITE_OTHER): Payer: BLUE CROSS/BLUE SHIELD | Admitting: Family Medicine

## 2017-09-23 VITALS — BP 110/68 | HR 82 | Temp 98.6°F | Resp 16 | Ht 63.5 in | Wt 243.4 lb

## 2017-09-23 DIAGNOSIS — J302 Other seasonal allergic rhinitis: Secondary | ICD-10-CM | POA: Diagnosis not present

## 2017-09-23 MED ORDER — FLUTICASONE PROPIONATE 50 MCG/ACT NA SUSP: 2.0000 | Freq: Every day | NASAL | 6 refills | Status: AC

## 2017-09-23 NOTE — Progress Notes (Signed)
   9/28/20184:45 PM  Bridget Thomas 1986/08/21, 31 y.o. female [id number]  Chief Complaint  Patient presents with  . Sinus Problem    X 1 week  . Fatigue  . Cough    HPI:   Patient is a 31 y.o. female who presents today for 1 week of nasal congestion, sinus pressure, sneezing, minimally productive cough, fatigue. Denies ear pain, sore throat, SOB, wheezing, nausea, vomiting, diarrhea or rashes.   Depression screen [facility] 2/9 09/23/2017 01/24/2017 01/21/2017  Decreased Interest 0 0 0  Down, Depressed, Hopeless 0 0 0  PHQ - 2 Score 0 0 0    No Known Allergies  Current Outpatient Prescriptions on File Prior to Visit  Medication Sig Dispense Refill  . acetaminophen (TYLENOL) 325 MG tablet Take 650 mg by mouth every 6 (six) hours as needed.     No current facility-administered medications on file prior to visit.     History reviewed. No pertinent past medical history.  History reviewed. No pertinent surgical history.  Social History  Substance Use Topics  . Smoking status: Never Smoker  . Smokeless tobacco: Never Used  . Alcohol use 0.5 oz/week    1 Standard drinks or equivalent per week    Family History  Problem Relation Age of Onset  . Hypertension Mother   . Hypertension Father   . Heart disease Maternal Grandfather   . Heart disease Paternal Grandfather     ROS Per HPI  OBJECTIVE:  Blood pressure 110/68, pulse 82, temperature 98.6 F (37 C), temperature source Oral, resp. rate 16, height 5' 3.5" (1.613 m), weight 243 lb 6.4 oz (110.4 kg), SpO2 98 %.  Physical Exam  Constitutional: She is oriented to person, place, and time and well-developed, well-nourished, and in no distress.  HENT:  Head: Normocephalic and atraumatic.  Right Ear: Hearing, tympanic membrane, external ear and ear canal normal.  Left Ear: Hearing, tympanic membrane, external ear and ear canal normal.  Nose: Mucosal edema and rhinorrhea present. Right sinus exhibits no maxillary sinus  tenderness and no frontal sinus tenderness. Left sinus exhibits no maxillary sinus tenderness and no frontal sinus tenderness.  Mouth/Throat: Oropharynx is clear and moist and mucous membranes are normal.  Eyes: Pupils are equal, round, and reactive to light. EOM are normal.  Neck: Neck supple.  Cardiovascular: Normal rate, regular rhythm and normal heart sounds.  Exam reveals no gallop and no friction rub.   No murmur heard. Pulmonary/Chest: Effort normal and breath sounds normal. She has no wheezes. She has no rales.  Lymphadenopathy:    She has no cervical adenopathy.  Neurological: She is alert and oriented to person, place, and time. Gait normal.  Skin: Skin is warm and dry.    ASSESSMENT and PLAN  1. Seasonal allergic rhinitis, unspecified trigger Discussed conservative measures, use of nasal saline washes, OTC oral anti-histamines, rx flonase. Patient educational handout given. RTC precautions given.  - fluticasone (FLONASE) 50 MCG/ACT nasal spray; Place 2 sprays into both nostrils daily.    Return if symptoms worsen or fail to improve.    Myles Lipps, MD Primary Care at [facility] [address] [place], [place] [address] Ph.  [phone] Fax [phone]

## 2017-09-23 NOTE — Patient Instructions (Addendum)
IF you received an x-ray today, you will receive an invoice from [facility]. Please contact [facility] at [phone] with questions or concerns regarding your invoice.   IF you received labwork today, you will receive an invoice from [place]. Please contact [facility] at [phone] with questions or concerns regarding your invoice.   Our billing staff will not be able to assist you with questions regarding bills from these companies.  You will be contacted with the lab results as soon as they are available. The fastest way to get your results is to activate your My Chart account. Instructions are located on the last page of this paperwork. If you have not heard from [place] regarding the results in 2 weeks, please contact this office.     Nasal Allergies Nasal allergies are a reaction to allergens in the air. Allergens are particles in the air that cause your body to have an allergic reaction. Nasal allergies are not passed from person to person (are not contagious). They cannot be cured, but they can be controlled. What are the causes? Seasonal nasal allergies (hay fever) are caused by pollen allergens that come from grasses, trees, and weeds. Year-round nasal allergies (perennial allergic rhinitis) are caused by allergens such as house dust mites, pet dander, and mold spores. What increases the risk? The following factors may make you more likely to develop this condition:  Having certain health conditions. These include: ? Other types of allergies, such as food allergies. ? Asthma. ? Eczema.  Having a close relative who has allergies or asthma.  Exposure to house dust, pollen, dander, or other allergens at home or at work.  Exposure to air pollution or secondhand smoke when you were a child.  What are the signs or symptoms? Symptoms of this condition include:  Sneezing.  Runny nose or stuffy nose (congestion).  Watery (tearing) eyes.  Itchy eyes,  nose, mouth, throat, skin, or other area.  Sore throat.  Headache.  Decreased sense of smell or taste.  Fatigue. This may occur if you have trouble sleeping due to allergies.  Swollen eyelids.  How is this diagnosed? This condition is diagnosed with a medical history and physical exam. Allergy testing may be done to determine exactly what triggers your nasal allergies. How is this treated? There is no cure for nasal allergies. Treatment focuses on controlling your symptoms, and it may include:  Medicines that block allergy symptoms. These may include allergy shots, nasal sprays, and oral antihistamines.  Avoiding the allergen.  Follow these instructions at home:  Avoid the allergen that is causing your symptoms, if possible.  Keep windows closed. If possible, use air conditioning when pollen counts are high.  Do not use fans in your home.  Do not hang clothes outside to dry.  Wear sunglasses to keep pollen out of your eyes.  Wash your hands right away after you touch household pets.  Take over-the-counter and prescription medicines only as told by your health care provider.  Keep all follow-up visits as told by your health care provider. This is important. Contact a health care provider if:  You have a fever.  You develop a cough that does not go away (is persistent).  You start to wheeze.  Your symptoms do not improve with treatment.  You have thick nasal discharge.  You start to have nosebleeds. Get help right away if:  Your tongue or your lips are swollen.  You have trouble breathing.  You feel light-headed or  you feel like you are going to faint.  You have cold sweats. This information is not intended to replace advice given to you by your health care provider. Make sure you discuss any questions you have with your health care provider. Document Released: 12/13/2005 Document Revised: 05/17/2016 Document Reviewed: 06/25/2015 Elsevier Interactive  Patient Education  [employer].

## 2017-09-24 ENCOUNTER — Ambulatory Visit: Payer: BLUE CROSS/BLUE SHIELD | Admitting: Family Medicine

## 2017-10-03 DIAGNOSIS — Z113 Encounter for screening for infections with a predominantly sexual mode of transmission: Secondary | ICD-10-CM | POA: Diagnosis not present

## 2017-10-03 DIAGNOSIS — Z3200 Encounter for pregnancy test, result unknown: Secondary | ICD-10-CM | POA: Diagnosis not present

## 2017-10-03 DIAGNOSIS — N870 Mild cervical dysplasia: Secondary | ICD-10-CM | POA: Diagnosis not present

## 2017-10-03 DIAGNOSIS — R87810 Cervical high risk human papillomavirus (HPV) DNA test positive: Secondary | ICD-10-CM | POA: Diagnosis not present

## 2017-10-03 DIAGNOSIS — Z118 Encounter for screening for other infectious and parasitic diseases: Secondary | ICD-10-CM | POA: Diagnosis not present

## 2017-12-28 DIAGNOSIS — R1031 Right lower quadrant pain: Secondary | ICD-10-CM | POA: Diagnosis not present

## 2017-12-28 DIAGNOSIS — R5383 Other fatigue: Secondary | ICD-10-CM | POA: Diagnosis not present

## 2017-12-28 DIAGNOSIS — R1032 Left lower quadrant pain: Secondary | ICD-10-CM | POA: Diagnosis not present

## 2017-12-28 DIAGNOSIS — M545 Low back pain: Secondary | ICD-10-CM | POA: Diagnosis not present

## 2017-12-28 DIAGNOSIS — R52 Pain, unspecified: Secondary | ICD-10-CM | POA: Diagnosis not present

## 2018-01-11 ENCOUNTER — Other Ambulatory Visit: Payer: Self-pay

## 2018-01-11 ENCOUNTER — Encounter: Payer: Self-pay | Admitting: Physician Assistant

## 2018-01-11 ENCOUNTER — Ambulatory Visit: Payer: BLUE CROSS/BLUE SHIELD | Admitting: Physician Assistant

## 2018-01-11 VITALS — BP 106/70 | HR 75 | Temp 99.1°F | Resp 18 | Ht 63.78 in | Wt 256.6 lb

## 2018-01-11 DIAGNOSIS — J069 Acute upper respiratory infection, unspecified: Secondary | ICD-10-CM

## 2018-01-11 DIAGNOSIS — R05 Cough: Secondary | ICD-10-CM | POA: Diagnosis not present

## 2018-01-11 DIAGNOSIS — R058 Other specified cough: Secondary | ICD-10-CM

## 2018-01-11 DIAGNOSIS — R5383 Other fatigue: Secondary | ICD-10-CM

## 2018-01-11 LAB — POCT INFLUENZA A/B
INFLUENZA A, POC: NEGATIVE
INFLUENZA B, POC: NEGATIVE

## 2018-01-11 MED ORDER — BENZONATATE 100 MG PO CAPS: 100.0000 mg | ORAL_CAPSULE | Freq: Three times a day (TID) | ORAL | 0 refills | Status: DC | PRN

## 2018-01-11 MED ORDER — HYDROCODONE-HOMATROPINE 5-1.5 MG/5ML PO SYRP: 5.0000 mL | ORAL_SOLUTION | Freq: Three times a day (TID) | ORAL | 0 refills | Status: DC | PRN

## 2018-01-11 NOTE — Patient Instructions (Addendum)
- We will treat this as a respiratory viral infection.  - I recommend you rest, drink plenty of fluids, eat light meals including soups.  -You may use nasal spray, zyrtec, and sudafed for nasal congestion and sinus congestion. - You may use cough syrup at night for your cough and sore throat, Tessalon pearls during the day. Be aware that cough syrup can definitely make you drowsy and sleepy so do not drive or operate any heavy machinery if it is affecting you during the day.  - You may also use Tylenol or ibuprofen over-the-counter for your sore throat.  - Please let me know if you are not seeing any improvement or get worse in 3-5 days.     Upper Respiratory Infection, Adult Most upper respiratory infections (URIs) are caused by a virus. A URI affects the nose, throat, and upper air passages. The most common type of URI is often called "the common cold." Follow these instructions at home:  Take medicines only as told by your doctor.  Gargle warm saltwater or take cough drops to comfort your throat as told by your doctor.  Use a warm mist humidifier or inhale steam from a shower to increase air moisture. This may make it easier to breathe.  Drink enough fluid to keep your pee (urine) clear or pale yellow.  Eat soups and other clear broths.  Have a healthy diet.  Rest as needed.  Go back to work when your fever is gone or your doctor says it is okay. ? You may need to stay home longer to avoid giving your URI to others. ? You can also wear a face mask and wash your hands often to prevent spread of the virus.  Use your inhaler more if you have asthma.  Do not use any tobacco products, including cigarettes, chewing tobacco, or electronic cigarettes. If you need help quitting, ask your doctor. Contact a doctor if:  You are getting worse, not better.  Your symptoms are not helped by medicine.  You have chills.  You are getting more short of breath.  You have brown or red  mucus.  You have yellow or brown discharge from your nose.  You have pain in your face, especially when you bend forward.  You have a fever.  You have puffy (swollen) neck glands.  You have pain while swallowing.  You have white areas in the back of your throat. Get help right away if:  You have very bad or constant: ? Headache. ? Ear pain. ? Pain in your forehead, behind your eyes, and over your cheekbones (sinus pain). ? Chest pain.  You have long-lasting (chronic) lung disease and any of the following: ? Wheezing. ? Long-lasting cough. ? Coughing up blood. ? A change in your usual mucus.  You have a stiff neck.  You have changes in your: ? Vision. ? Hearing. ? Thinking. ? Mood. This information is not intended to replace advice given to you by your health care provider. Make sure you discuss any questions you have with your health care provider. Document Released: 05/31/2008 Document Revised: 08/15/2016 Document Reviewed: 03/20/2014 Elsevier Interactive Patient Education  2018 [employer]   IF you received an x-ray today, you will receive an invoice [facility]. Please contact [facility] at [phone] with questions or concerns regarding your invoice.   IF you received labwork today, you will receive an invoice from [facility]. Please contact [facility] at [phone] with questions or concerns regarding your invoice.  Our billing staff will not be able to assist you with questions regarding bills from these companies.  You will be contacted with the lab results as soon as they are available. The fastest way to get your results is to activate your My Chart account. Instructions are located on the last page of this paperwork. If you have not heard from us regarding the results in 2 weeks, please contact this office.     

## 2018-01-11 NOTE — Progress Notes (Signed)
MRN: [id number] DOB: 07/25/1986  Subjective:   Bridget Thomas is a 32 y.o. female presenting for chief complaint of Cough (X- 2-3 days); Headache (off and on); and Generalized Body Aches .   Reports one week history of illness. Notes it started out with sinus congestion, rhinorrhea, and sore throat and then settled into her chest causing her to have a dry cough.  Sinus congestion has started to improve. Has some body aches and fatigued.  Denies fever, wheezing, shortness of breath and chest pain, nausea, vomiting, abdominal pain and diarrhea. Has tried mucinex, theraflu, cough drop, and atrovent nasal spray with relief. Notes this is exactly how she felt last year when she had a URI. Has not had sick contact with anyone. Has history of seasonal allergies, no history of asthma or COPD. Patient has not had flu shot this season.  Denies smoking.  Denies any other aggravating or relieving factors, no other questions or concerns.  Bridget Thomas has a current medication list which includes the following prescription(s): acetaminophen, fluticasone, norethindrone-ethinyl estradiol-fe biphas, and timolol. Also has No Known Allergies.  Bridget Thomas  has no past medical history on file. Also  has no past surgical history on file.   Objective:   Vitals: BP 106/70 (BP Location: Right Arm, Patient Position: Sitting, Cuff Size: Large)   Pulse 75   Temp 99.1 F (37.3 C) (Oral)   Resp 18   Ht 5' 3.78" (1.62 m)   Wt 256 lb 9.6 oz (116.4 kg)   SpO2 99%   BMI 44.35 kg/m   Physical Exam  Constitutional: She is oriented to person, place, and time. She appears well-developed and well-nourished. No distress.  HENT:  Head: Normocephalic and atraumatic.  Right Ear: External ear and ear canal normal. Tympanic membrane is not erythematous and not bulging. A middle ear effusion is present.  Left Ear: External ear and ear canal normal. Tympanic membrane is not erythematous and not bulging. A middle ear effusion is  present.  Nose: Mucosal edema ( mild bilaterally) present. Right sinus exhibits maxillary sinus tenderness. Right sinus exhibits no frontal sinus tenderness. Left sinus exhibits maxillary sinus tenderness. Left sinus exhibits no frontal sinus tenderness.  Mouth/Throat: Uvula is midline and mucous membranes are normal. Posterior oropharyngeal erythema present. No tonsillar exudate.  Eyes: Conjunctivae are normal.  Neck: Normal range of motion.  Cardiovascular: Normal rate, regular rhythm and normal heart sounds.  Pulmonary/Chest: Effort normal and breath sounds normal. She has no wheezes. She has no rhonchi. She has no rales.  Lymphadenopathy:       Head (right side): No submental, no submandibular, no tonsillar, no preauricular, no posterior auricular and no occipital adenopathy present.       Head (left side): No submental, no submandibular, no tonsillar, no preauricular, no posterior auricular and no occipital adenopathy present.    She has no cervical adenopathy.       Right: No supraclavicular adenopathy present.       Left: No supraclavicular adenopathy present.  Neurological: She is alert and oriented to person, place, and time.  Skin: Skin is warm and dry.  Psychiatric: She has a normal mood and affect.  Vitals reviewed.   Results for orders placed or performed in visit on 01/11/18 (from the past 24 hour(s))  POCT Influenza A/B     Status: None   Collection Time: 01/11/18  2:04 PM  Result Value Ref Range   Influenza A, POC Negative Negative   Influenza B, POC  Negative Negative    Assessment and Plan :  1. Other fatigue - POCT Influenza A/B 2. Acute upper respiratory infection Physical exam and point of care lab findings reassuring. Pt is well appearing, no distress. Vitals stable, she is afebrile. Lungs CTAB. Hx and PE findings consistent with a viral etiology, will treat symptomatically at this time. Patient instructed to return to clinic if symptoms worsen, do not improve in  3-5 days, or as needed. 3. Post-viral cough syndrome - benzonatate (TESSALON) 100 MG capsule; Take 1-2 capsules (100-200 mg total) by mouth 3 (three) times daily as needed for cough.  Dispense: 40 capsule; Refill: 0 - HYDROcodone-homatropine (HYCODAN) 5-1.5 MG/5ML syrup; Take 5 mLs by mouth every 8 (eight) hours as needed for cough.  Dispense: 120 mL; Refill: 0  Benjiman CoreBrittany Samanthajo Payano, PA-C  Primary Care at [facility] 01/11/2018 2:12 PM

## 2018-01-17 ENCOUNTER — Telehealth: Payer: Self-pay | Admitting: Physician Assistant

## 2018-01-17 NOTE — Telephone Encounter (Signed)
Copied from CRM [phone]. Topic: Quick Communication - See Telephone Encounter >> Jan 17, 2018  1:05 PM Waymon AmatoBurton, Donna F wrote: CRM for notification. See Telephone encounter for:   01/17/18.pt is not feeling any better still taking the cough meds but feels she needs the antibiotics as brittany told her to call back to let her know it is ok to leave message best number [phone]

## 2018-01-18 NOTE — Telephone Encounter (Signed)
°  Relation to pt: self  Call back number: [phone] Pharmacy: [id number] - [place], [place] - [address] [phone] (Phone) [phone] (Fax)     Reason for call:  Patient checking on the status of message mentioned below, patient was seen by Benjiman CoreBrittany Wiseman 01/11/18 and patient states PA advised if symptoms didn't improved she would prescribe antibiotics. Informed patient Benjiman CoreBrittany Wiseman is not in the office today. Patient would like to speak with a nurse today regarding clinical advice or if abx can be sent in for her chest congestion. Patient states please leave detail message.

## 2018-01-18 NOTE — Telephone Encounter (Signed)
I called patient and advised if symptoms has not improved she need to return to the office. Pt states that she is feeling a lot better but still has a cough that has not went away. I asked the pt if she would like to come in today and see another provider due to [place] not in today or come in tomorrow and see Barnett AbuWiseman. Pt states that she was not able to come in today and will hold on and see how she feel in a few days before coming back  to be seen.

## 2018-01-19 DIAGNOSIS — M545 Low back pain: Secondary | ICD-10-CM | POA: Diagnosis not present

## 2018-01-30 ENCOUNTER — Encounter: Payer: Self-pay | Admitting: Physician Assistant

## 2018-01-30 ENCOUNTER — Ambulatory Visit (INDEPENDENT_AMBULATORY_CARE_PROVIDER_SITE_OTHER): Payer: BLUE CROSS/BLUE SHIELD

## 2018-01-30 ENCOUNTER — Ambulatory Visit: Payer: BLUE CROSS/BLUE SHIELD | Admitting: Physician Assistant

## 2018-01-30 VITALS — BP 128/76 | HR 112 | Temp 102.2°F | Resp 16 | Ht 63.0 in | Wt 248.0 lb

## 2018-01-30 DIAGNOSIS — R0689 Other abnormalities of breathing: Secondary | ICD-10-CM

## 2018-01-30 DIAGNOSIS — J0140 Acute pansinusitis, unspecified: Secondary | ICD-10-CM | POA: Diagnosis not present

## 2018-01-30 DIAGNOSIS — R059 Cough, unspecified: Secondary | ICD-10-CM

## 2018-01-30 DIAGNOSIS — R509 Fever, unspecified: Secondary | ICD-10-CM

## 2018-01-30 DIAGNOSIS — R058 Other specified cough: Secondary | ICD-10-CM

## 2018-01-30 DIAGNOSIS — R05 Cough: Secondary | ICD-10-CM

## 2018-01-30 LAB — POCT CBC
Granulocyte percent: 83.9 %G — AB (ref 37–80)
HCT, POC: 37.9 % (ref 37.7–47.9)
HEMOGLOBIN: 12.7 g/dL (ref 12.2–16.2)
Lymph, poc: 0.6 (ref 0.6–3.4)
MCH: 31.1 pg (ref 27–31.2)
MCHC: 33.7 g/dL (ref 31.8–35.4)
MCV: 92.4 fL (ref 80–97)
MID (cbc): 0.3 (ref 0–0.9)
MPV: 6.8 fL (ref 0–99.8)
PLATELET COUNT, POC: 326 10*3/uL (ref 142–424)
POC Granulocyte: 4.9 (ref 2–6.9)
POC LYMPH PERCENT: 11.2 %L (ref 10–50)
POC MID %: 4.9 %M (ref 0–12)
RBC: 4.1 M/uL (ref 4.04–5.48)
RDW, POC: 14 %
WBC: 5.8 10*3/uL (ref 4.6–10.2)

## 2018-01-30 MED ORDER — HYDROCODONE-HOMATROPINE 5-1.5 MG/5ML PO SYRP: 5.0000 mL | ORAL_SOLUTION | Freq: Two times a day (BID) | ORAL | 0 refills | Status: AC | PRN

## 2018-01-30 MED ORDER — AMOXICILLIN-POT CLAVULANATE 875-125 MG PO TABS: 1.0000 | ORAL_TABLET | Freq: Two times a day (BID) | ORAL | 0 refills | Status: AC

## 2018-01-30 MED ORDER — IPRATROPIUM BROMIDE 0.03 % NA SOLN: 2.0000 | Freq: Two times a day (BID) | NASAL | 0 refills | Status: DC

## 2018-01-30 MED ORDER — BENZONATATE 100 MG PO CAPS: 100.0000 mg | ORAL_CAPSULE | Freq: Three times a day (TID) | ORAL | 0 refills | Status: AC | PRN

## 2018-01-30 MED ORDER — HYDROCODONE-HOMATROPINE 5-1.5 MG/5ML PO SYRP: 5.0000 mL | ORAL_SOLUTION | Freq: Two times a day (BID) | ORAL | 0 refills | Status: DC | PRN

## 2018-01-30 NOTE — Patient Instructions (Addendum)
I would like you to take the antibiotic as prescribed.   I would like you to follow up in 1 week.   Please take medication as prescribed.    Sinusitis, Adult Sinusitis is soreness and inflammation of your sinuses. Sinuses are hollow spaces in the bones around your face. They are located:  Around your eyes.  In the middle of your forehead.  Behind your nose.  In your cheekbones.  Your sinuses and nasal passages are lined with a stringy fluid (mucus). Mucus normally drains out of your sinuses. When your nasal tissues get inflamed or swollen, the mucus can get trapped or blocked so air cannot flow through your sinuses. This lets bacteria, viruses, and funguses grow, and that leads to infection. Follow these instructions at home: Medicines  Take, use, or apply over-the-counter and prescription medicines only as told by your doctor. These may include nasal sprays.  If you were prescribed an antibiotic medicine, take it as told by your doctor. Do not stop taking the antibiotic even if you start to feel better. Hydrate and Humidify  Drink enough water to keep your pee (urine) clear or pale yellow.  Use a cool mist humidifier to keep the humidity level in your home above 50%.  Breathe in steam for 10-15 minutes, 3-4 times a day or as told by your doctor. You can do this in the bathroom while a hot shower is running.  Try not to spend time in cool or dry air. Rest  Rest as much as possible.  Sleep with your head raised (elevated).  Make sure to get enough sleep each night. General instructions  Put a warm, moist washcloth on your face 3-4 times a day or as told by your doctor. This will help with discomfort.  Wash your hands often with soap and water. If there is no soap and water, use hand sanitizer.  Do not smoke. Avoid being around people who are smoking (secondhand smoke).  Keep all follow-up visits as told by your doctor. This is important. Contact a doctor if:  You have  a fever.  Your symptoms get worse.  Your symptoms do not get better within 10 days. Get help right away if:  You have a very bad headache.  You cannot stop throwing up (vomiting).  You have pain or swelling around your face or eyes.  You have trouble seeing.  You feel confused.  Your neck is stiff.  You have trouble breathing. This information is not intended to replace advice given to you by your health care provider. Make sure you discuss any questions you have with your health care provider. Document Released: 05/31/2008 Document Revised: 08/08/2016 Document Reviewed: 10/08/2015 Elsevier Interactive Patient Education  2018 [employer]     IF you received an x-ray today, you will receive an invoice from [facility]. Please contact [facility] at [phone] with questions or concerns regarding your invoice.   IF you received labwork today, you will receive an invoice from [facility]. Please contact [facility] at [phone] with questions or concerns regarding your invoice.   Our billing staff will not be able to assist you with questions regarding bills from these companies.  You will be contacted with the lab results as soon as they are available. The fastest way to get your results is to activate your My Chart account. Instructions are located on the last page of this paperwork. If you have not heard from us regarding the results in 2 weeks, please contact this  office.

## 2018-01-30 NOTE — Progress Notes (Signed)
PRIMARY CARE AT [facility] [address], [place] [phone]  Date:  01/30/2018   Name:  Bridget Thomas   DOB:  [birth year]   MRN:  [id number]  PCP:  Daisy Florooss, Charles Alan, MD    History of Present Illness:  Bridget Thomas is a 32 y.o. female patient who presents to PCP with  Chief Complaint  Patient presents with  . Cough    x 1 wk, pt was seen 2 wks ago and was treated for uri,but pt not better.  . Fever    102.2  . Generalized Body Aches    x 1 wk     1 week ago, she  2 weeks ago she had a dry cough.  She had emesis.  Body aches.  She has fever and chills.  She has intermittent productive cough.  She feels when she does something, she feels winded.  Takes time tim walk up her three flights of stairs.    Patient Active Problem List   Diagnosis Date Noted  . Obesity, unspecified 09/10/2013  . Increased intraocular pressure 09/10/2013    No past medical history on file.  No past surgical history on file.  Social History   Tobacco Use  . Smoking status: Never Smoker  . Smokeless tobacco: Never Used  Substance Use Topics  . Alcohol use: Yes    Alcohol/week: 0.5 oz    Types: 1 Standard drinks or equivalent per week  . Drug use: No    Family History  Problem Relation Age of Onset  . Hypertension Mother   . Hypertension Father   . Heart disease Maternal Grandfather   . Heart disease Paternal Grandfather     No Known Allergies  Medication list has been reviewed and updated.  Current Outpatient Medications on File Prior to Visit  Medication Sig Dispense Refill  . acetaminophen (TYLENOL) 325 MG tablet Take 650 mg by mouth every 6 (six) hours as needed.    . fluticasone (FLONASE) 50 MCG/ACT nasal spray Place 2 sprays into both nostrils daily. 16 g 6  . Norethindrone-Ethinyl Estradiol-Fe Biphas (LO LOESTRIN FE) 1 MG-10 MCG / 10 MCG tablet Take 1 tablet by mouth daily.    . timolol (TIMOPTIC) 0.5 % ophthalmic solution Place 1 drop into both eyes every  morning.  5  . benzonatate (TESSALON) 100 MG capsule Take 1-2 capsules (100-200 mg total) by mouth 3 (three) times daily as needed for cough. (Patient not taking: Reported on 01/30/2018) 40 capsule 0  . HYDROcodone-homatropine (HYCODAN) 5-1.5 MG/5ML syrup Take 5 mLs by mouth every 8 (eight) hours as needed for cough. (Patient not taking: Reported on 01/30/2018) 120 mL 0   No current facility-administered medications on file prior to visit.     ROS ROS otherwise unremarkable unless listed above.  Physical Examination: BP 128/76   Pulse (!) 112   Temp (!) 102.2 F (39 C) (Oral)   Resp 16   Ht 5\' 3"  (1.6 m)   Wt 248 lb (112.5 kg)   SpO2 97%   BMI 43.93 kg/m  Ideal Body Weight: Weight in (lb) to have BMI = 25: 140.8  Physical Exam  Constitutional: She is oriented to person, place, and time. She appears well-developed and well-nourished. No distress.  HENT:  Head: Normocephalic and atraumatic.  Right Ear: Tympanic membrane, external ear and ear canal normal.  Left Ear: Tympanic membrane, external ear and ear canal normal.  Nose: Mucosal edema and rhinorrhea present. Right sinus exhibits  maxillary sinus tenderness. Right sinus exhibits no frontal sinus tenderness. Left sinus exhibits maxillary sinus tenderness. Left sinus exhibits no frontal sinus tenderness.  Mouth/Throat: No uvula swelling. No oropharyngeal exudate, posterior oropharyngeal edema or posterior oropharyngeal erythema.  Eyes: Conjunctivae and EOM are normal. Pupils are equal, round, and reactive to light.  Cardiovascular: Normal rate and regular rhythm. Exam reveals no gallop, no distant heart sounds and no friction rub.  No murmur heard. Pulmonary/Chest: Effort normal. No respiratory distress. She has no decreased breath sounds. She has no wheezes. She has no rhonchi.  Lymphadenopathy:       Head (right side): No submandibular, no tonsillar, no preauricular and no posterior auricular adenopathy present.       Head (left  side): No submandibular, no tonsillar, no preauricular and no posterior auricular adenopathy present.  Neurological: She is alert and oriented to person, place, and time.  Skin: She is not diaphoretic.  Psychiatric: She has a normal mood and affect. Her behavior is normal.     Dg Chest 2 View  Result Date: 01/30/2018 CLINICAL DATA:  Productive cough.  One week of dyspnea. EXAM: CHEST  2 VIEW COMPARISON:  11/03/2013 FINDINGS: The heart size and mediastinal contours are within normal limits. Both lungs are clear. The visualized skeletal structures are unremarkable. IMPRESSION: No active cardiopulmonary disease. Electronically Signed   By: Signa Kell M.D.   On: 01/30/2018 15:17     Assessment and Plan: Bridget Thomas is a 32 y.o. female who is here today for cc of  Chief Complaint  Patient presents with  . Cough    x 1 wk, pt was seen 2 wks ago and was treated for uri,but pt not better.  . Fever    102.2  . Generalized Body Aches    x 1 wk   Acute non-recurrent pansinusitis - Plan: amoxicillin-clavulanate (AUGMENTIN) 875-125 MG tablet, benzonatate (TESSALON) 100 MG capsule, ipratropium (ATROVENT) 0.03 % nasal spray, HYDROcodone-homatropine (HYCODAN) 5-1.5 MG/5ML syrup, DISCONTINUED: HYDROcodone-homatropine (HYCODAN) 5-1.5 MG/5ML syrup  Fever, unspecified fever cause - Plan: DG Chest 2 View, POCT CBC  Cough - Plan: DG Chest 2 View  Winded - Plan: DG Chest 2 View  Post-viral cough syndrome  Trena Platt, PA-C [facility] 2/17/20198:40 PM

## 2018-02-02 ENCOUNTER — Ambulatory Visit: Payer: BLUE CROSS/BLUE SHIELD | Admitting: Physician Assistant

## 2018-02-02 ENCOUNTER — Encounter: Payer: Self-pay | Admitting: Physician Assistant

## 2018-02-02 VITALS — BP 111/74 | HR 75 | Temp 98.5°F | Resp 18 | Ht 63.0 in | Wt 246.0 lb

## 2018-02-02 DIAGNOSIS — Z09 Encounter for follow-up examination after completed treatment for conditions other than malignant neoplasm: Secondary | ICD-10-CM | POA: Diagnosis not present

## 2018-02-02 DIAGNOSIS — J0140 Acute pansinusitis, unspecified: Secondary | ICD-10-CM

## 2018-02-02 NOTE — Progress Notes (Signed)
PRIMARY CARE AT [facility] [address] [place] [address] [phone]  Date:  02/02/2018   Name:  LENNYX VERDELL   DOB:  December 15, 1986   MRN:  [id number]  PCP:  Daisy Floro, MD    History of Present Illness:  CHANA LINDSTROM is a 32 y.o. female patient who presents to PCP with  Chief Complaint  Patient presents with  . PANSINUSITIS    follow up       Patient Active Problem List   Diagnosis Date Noted  . Obesity, unspecified 09/10/2013  . Increased intraocular pressure 09/10/2013    No past medical history on file.  No past surgical history on file.  Social History   Tobacco Use  . Smoking status: Never Smoker  . Smokeless tobacco: Never Used  Substance Use Topics  . Alcohol use: Yes    Alcohol/week: 0.5 oz    Types: 1 Standard drinks or equivalent per week  . Drug use: No    Family History  Problem Relation Age of Onset  . Hypertension Mother   . Hypertension Father   . Heart disease Maternal Grandfather   . Heart disease Paternal Grandfather     No Known Allergies  Medication list has been reviewed and updated.  Current Outpatient Medications on File Prior to Visit  Medication Sig Dispense Refill  . acetaminophen (TYLENOL) 325 MG tablet Take 650 mg by mouth every 6 (six) hours as needed.    Marland Kitchen amoxicillin-clavulanate (AUGMENTIN) 875-125 MG tablet Take 1 tablet by mouth 2 (two) times daily for 10 days. 20 tablet 0  . benzonatate (TESSALON) 100 MG capsule Take 1-2 capsules (100-200 mg total) by mouth 3 (three) times daily as needed for cough. 40 capsule 0  . fluticasone (FLONASE) 50 MCG/ACT nasal spray Place 2 sprays into both nostrils daily. 16 g 6  . HYDROcodone-homatropine (HYCODAN) 5-1.5 MG/5ML syrup Take 5 mLs by mouth every 12 (twelve) hours as needed for cough. 100 mL 0  . ipratropium (ATROVENT) 0.03 % nasal spray Place 2 sprays into both nostrils 2 (two) times daily. 30 mL 0  . Norethindrone-Ethinyl Estradiol-Fe Biphas (LO LOESTRIN FE) 1  MG-10 MCG / 10 MCG tablet Take 1 tablet by mouth daily.    . timolol (TIMOPTIC) 0.5 % ophthalmic solution Place 1 drop into both eyes every morning.  5   No current facility-administered medications on file prior to visit.     ROS ROS otherwise unremarkable unless listed above.  Physical Examination: BP 111/74   Pulse 75   Temp 98.5 F (36.9 C) (Oral)   Resp 18   Ht 5\' 3"  (1.6 m)   Wt 246 lb (111.6 kg)   SpO2 98%   BMI 43.58 kg/m  Ideal Body Weight: Weight in (lb) to have BMI = 25: 140.8  Physical Exam   Assessment and Plan: VIRTIE BUNGERT is a 32 y.o. female who is here today for cc of  Chief Complaint  Patient presents with  . PANSINUSITIS    follow up   Patient reports that she has improvement of her symptoms though this is not completely improved.  She was seen here 2 weeks ago by [place] after one week of illness.  Patient was treated supportively and returned with continued symptoms and fever.  Treated with Augmentin, Atrovent, and hycodan.  She then returns today, with some improvement of her symptoms.  Not feeling winded, but continues to feel drained and fatigue.   Appears to be  resolving.  Advised heavy hydration, and continuance of supportive treatment.   Acute non-recurrent pansinusitis  Follow up  Trena PlattStephanie Desyre Calma, PA-C [facility] 2/26/20198:51 AM

## 2018-02-02 NOTE — Patient Instructions (Addendum)
  Please make sure you are hydrating well with 64 oz of water if not more.   I would like you to continue taking the medication as prescribed.    Let's follow up in 1 week.  Do not over exert yourself.  If you are still having symptoms, I want you to come in for blood work.   IF you received an x-ray today, you will receive an invoice from [facility]. Please contact [facility] at [phone] with questions or concerns regarding your invoice.   IF you received labwork today, you will receive an invoice from [facility]. Please contact [facility] at [phone] with questions or concerns regarding your invoice.   Our billing staff will not be able to assist you with questions regarding bills from these companies.  You will be contacted with the lab results as soon as they are available. The fastest way to get your results is to activate your My Chart account. Instructions are located on the last page of this paperwork. If you have not heard from us regarding the results in 2 weeks, please contact this office.

## 2018-02-07 ENCOUNTER — Ambulatory Visit: Payer: Self-pay | Admitting: *Deleted

## 2018-02-07 ENCOUNTER — Telehealth: Payer: Self-pay | Admitting: Physician Assistant

## 2018-02-07 NOTE — Telephone Encounter (Signed)
Vaginal soreness, burning and itching, onset Saturday 02/04/18 after course of antibiotics; vaginal area and labia. No discharge. Denies pain with urination. Has tried OTC cream since Sunday,also probiotics. Requesting oral medication be called in for her. States too sore for vaginal suppository use.  Please advise.  Reason for Disposition . [1] Symptoms of a yeast infection (i.e., itchy, white discharge, not bad smelling) AND    [2] feels like prior vaginal yeast infections  Answer Assessment - Initial Assessment Questions 1. SYMPTOM: "What's the main symptom you're concerned about?" (e.g., pain, itching, dryness)    Soreness, itching vaginal and labial area after course of antibiotics 2. LOCATION: "Where is the  _______ located?" (e.g., inside/outside, left/right)     All over 3. ONSET: "When did the  ________  start?"     Saturday 4. PAIN: "Is there any pain?" If so, ask: "How bad is it?" (Scale: 1-10; mild, moderate, severe)     Yes. Very sore  , moderate 5. ITCHING: "Is there any itching?" If so, ask: "How bad is it?" (Scale: 1-10; mild, moderate, severe)     Severe itching 6. CAUSE: "What do you think is causing the symptoms?"     Just finishing antibiotics 7. OTHER SYMPTOMS: "Do you have any other symptoms?" (e.g., vaginal bleeding, pain with urination)     No 8. PREGNANCY: "Is there any chance you are pregnant?" "When was your last menstrual period?"       7. CAUSE: "What do you think is causing the discharge?" "Have you had the same problem before? What happened then?"No discharge 8. OTHER SYMPTOMS: "Do you have any other symptoms?" (e.g., fever, itching, vaginal bleeding, pain with urination, injury to genital area, vaginal foreign body) Vaginal soreness in and out, itching  Protocols used: VAGINAL [id number]

## 2018-02-07 NOTE — Telephone Encounter (Signed)
Copied from CRM [id number]. Topic: Quick Communication - See Telephone Encounter >> Feb 07, 2018  2:19 PM Waymon AmatoBurton, Donna F wrote: CRM for notification. See Telephone encounter for: pt is calling to see if she can get a diflucan due to a yeast infection from antibiotics given on 02/02/18 appt  [facility] [phone]   Best number [phone]  02/07/18. >> Feb 07, 2018  5:49 PM Rudi CocoLathan, Ayleen Mckinstry M, NT wrote: Pt. Calling back to see if anything has been called in for possible yeast infection. Pt. Can be reached at [phone]

## 2018-02-07 NOTE — Telephone Encounter (Signed)
Patient called to get clarification of the symptoms of a possible years infection. Left message for her to call the office back.

## 2018-02-07 NOTE — Telephone Encounter (Signed)
Copied from CRM #[id number]. Topic: Quick [phone] - See Telephone Encounter >> Feb 07, 2018  2:19 PM Waymon AmatoBurton, Donna F wrote: CRM for notification. See Telephone encounter for: pt is calling to see if she can get a diflucan due to a yeast infection from antibiotics given on 02/02/18 appt  [facility] [phone]   Best number [phone]  02/07/18.

## 2018-02-09 ENCOUNTER — Other Ambulatory Visit: Payer: Self-pay | Admitting: Physician Assistant

## 2018-02-09 DIAGNOSIS — B379 Candidiasis, unspecified: Secondary | ICD-10-CM

## 2018-02-09 MED ORDER — FLUCONAZOLE 150 MG PO TABS: 150.0000 mg | ORAL_TABLET | Freq: Once | ORAL | 0 refills | Status: AC

## 2018-02-09 NOTE — Telephone Encounter (Signed)
Pec transferred patient over   Patient was asked if she was just needing medication and what symptoms she was having.   Patient got angry and said she didn't want to talk to me and hung up.

## 2018-02-09 NOTE — Telephone Encounter (Signed)
sent 

## 2018-02-13 DIAGNOSIS — H401131 Primary open-angle glaucoma, bilateral, mild stage: Secondary | ICD-10-CM | POA: Diagnosis not present

## 2018-02-26 ENCOUNTER — Other Ambulatory Visit: Payer: Self-pay | Admitting: Physician Assistant

## 2018-02-26 DIAGNOSIS — J0140 Acute pansinusitis, unspecified: Secondary | ICD-10-CM

## 2018-03-26 IMAGING — DX DG CHEST 2V
2 series · 2 of 2 positions shown · non-contrast
Comparison: 11/03/2013

CLINICAL DATA: Productive cough.  One week of dyspnea.

EXAM:
CHEST  2 VIEW

[chest pa]
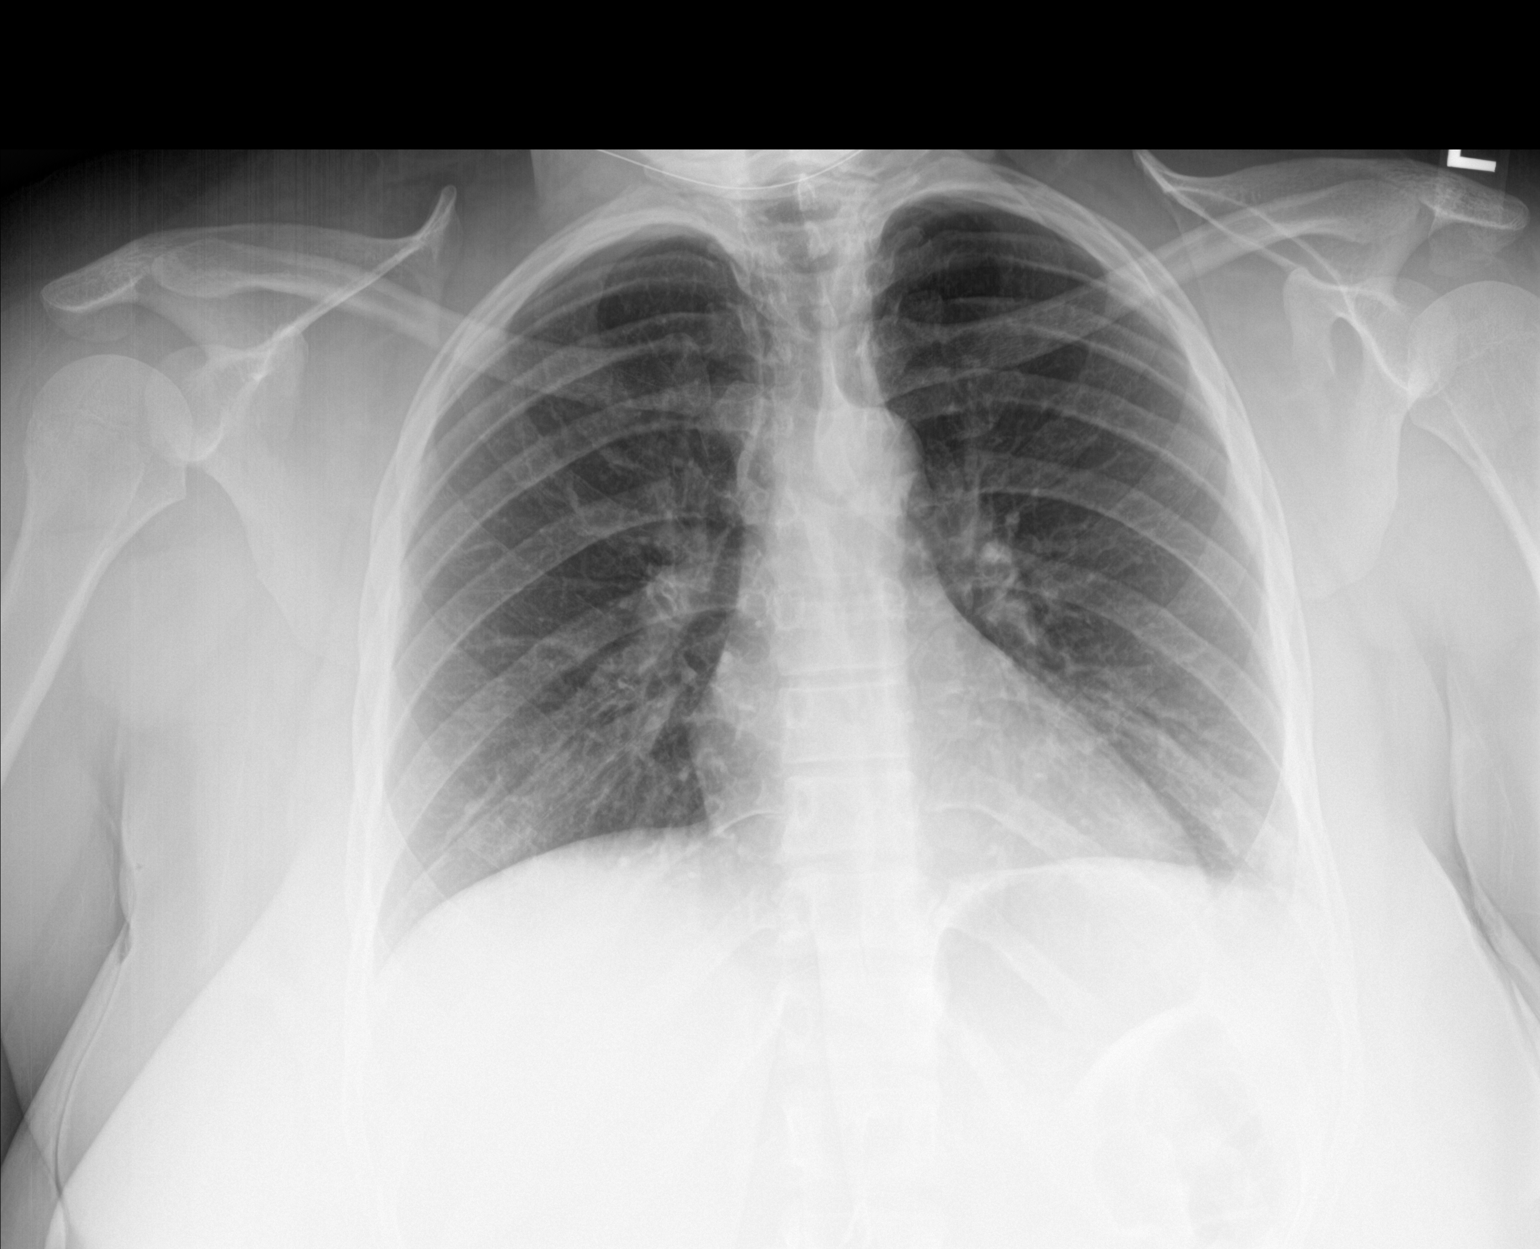

[chest lat]
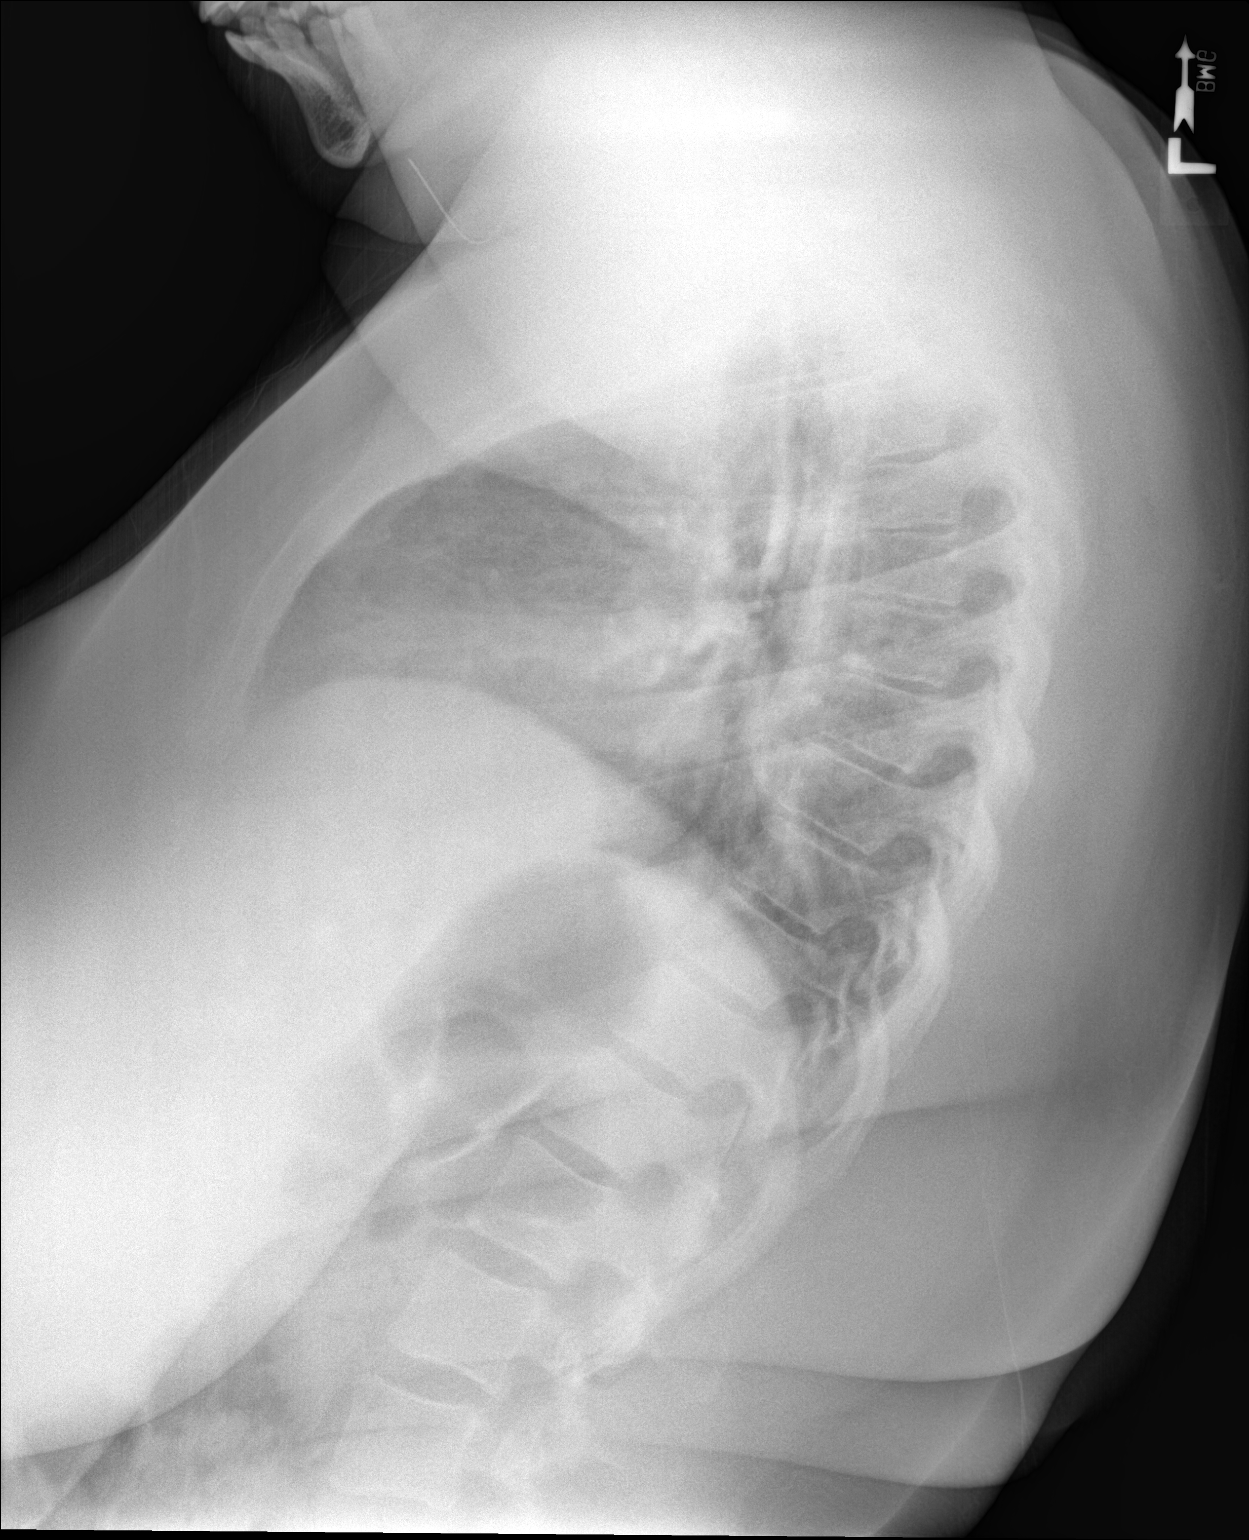

[2 of 2 positions shown; findings below may reference images not displayed]

FINDINGS: The heart size and mediastinal contours are within normal limits.
Both lungs are clear. The visualized skeletal structures are
unremarkable.
IMPRESSION: No active cardiopulmonary disease.

## 2018-03-28 ENCOUNTER — Encounter: Payer: Self-pay | Admitting: Physician Assistant

## 2018-04-07 DIAGNOSIS — L2089 Other atopic dermatitis: Secondary | ICD-10-CM | POA: Diagnosis not present

## 2018-05-30 DIAGNOSIS — H401131 Primary open-angle glaucoma, bilateral, mild stage: Secondary | ICD-10-CM | POA: Diagnosis not present

## 2018-09-26 DIAGNOSIS — R102 Pelvic and perineal pain: Secondary | ICD-10-CM | POA: Diagnosis not present

## 2018-09-26 DIAGNOSIS — B373 Candidiasis of vulva and vagina: Secondary | ICD-10-CM | POA: Diagnosis not present

## 2018-09-26 DIAGNOSIS — R1084 Generalized abdominal pain: Secondary | ICD-10-CM | POA: Diagnosis not present

## 2018-10-20 DIAGNOSIS — Z114 Encounter for screening for human immunodeficiency virus [HIV]: Secondary | ICD-10-CM | POA: Diagnosis not present

## 2018-10-20 DIAGNOSIS — Z01419 Encounter for gynecological examination (general) (routine) without abnormal findings: Secondary | ICD-10-CM | POA: Diagnosis not present

## 2018-10-20 DIAGNOSIS — B977 Papillomavirus as the cause of diseases classified elsewhere: Secondary | ICD-10-CM | POA: Diagnosis not present

## 2018-10-20 DIAGNOSIS — Z118 Encounter for screening for other infectious and parasitic diseases: Secondary | ICD-10-CM | POA: Diagnosis not present

## 2018-10-20 DIAGNOSIS — Z113 Encounter for screening for infections with a predominantly sexual mode of transmission: Secondary | ICD-10-CM | POA: Diagnosis not present

## 2018-10-20 DIAGNOSIS — Z1151 Encounter for screening for human papillomavirus (HPV): Secondary | ICD-10-CM | POA: Diagnosis not present

## 2018-10-20 DIAGNOSIS — Z124 Encounter for screening for malignant neoplasm of cervix: Secondary | ICD-10-CM | POA: Diagnosis not present

## 2018-10-20 DIAGNOSIS — Z1159 Encounter for screening for other viral diseases: Secondary | ICD-10-CM | POA: Diagnosis not present

## 2018-10-20 DIAGNOSIS — Z6841 Body Mass Index (BMI) 40.0 and over, adult: Secondary | ICD-10-CM | POA: Diagnosis not present

## 2018-11-06 DIAGNOSIS — Z23 Encounter for immunization: Secondary | ICD-10-CM | POA: Diagnosis not present

## 2018-11-06 DIAGNOSIS — Z0000 Encounter for general adult medical examination without abnormal findings: Secondary | ICD-10-CM | POA: Diagnosis not present

## 2019-02-12 DIAGNOSIS — H401131 Primary open-angle glaucoma, bilateral, mild stage: Secondary | ICD-10-CM | POA: Diagnosis not present

## 2019-06-12 DIAGNOSIS — H401131 Primary open-angle glaucoma, bilateral, mild stage: Secondary | ICD-10-CM | POA: Diagnosis not present

## 2019-06-19 ENCOUNTER — Other Ambulatory Visit: Payer: Self-pay | Admitting: Family Medicine

## 2019-06-19 DIAGNOSIS — J302 Other seasonal allergic rhinitis: Secondary | ICD-10-CM

## 2019-06-19 NOTE — Telephone Encounter (Signed)
Patient is requesting a refill on a medication that has not been prescribed since 2018 by Dr. Pamella Pert. / Patient has PCP listed as Dr. Harrington Challenger / I do not believe this is a [place] provider. / Will deny medication at this time.

## 2019-06-20 DIAGNOSIS — L503 Dermatographic urticaria: Secondary | ICD-10-CM | POA: Diagnosis not present

## 2019-06-20 DIAGNOSIS — J301 Allergic rhinitis due to pollen: Secondary | ICD-10-CM | POA: Diagnosis not present

## 2019-06-20 DIAGNOSIS — J3089 Other allergic rhinitis: Secondary | ICD-10-CM | POA: Diagnosis not present

## 2019-10-26 DIAGNOSIS — Z113 Encounter for screening for infections with a predominantly sexual mode of transmission: Secondary | ICD-10-CM | POA: Diagnosis not present

## 2019-10-26 DIAGNOSIS — Z1159 Encounter for screening for other viral diseases: Secondary | ICD-10-CM | POA: Diagnosis not present

## 2019-10-26 DIAGNOSIS — Z118 Encounter for screening for other infectious and parasitic diseases: Secondary | ICD-10-CM | POA: Diagnosis not present

## 2019-10-26 DIAGNOSIS — Z01419 Encounter for gynecological examination (general) (routine) without abnormal findings: Secondary | ICD-10-CM | POA: Diagnosis not present

## 2019-10-26 DIAGNOSIS — Z6841 Body Mass Index (BMI) 40.0 and over, adult: Secondary | ICD-10-CM | POA: Diagnosis not present

## 2019-10-26 DIAGNOSIS — Z114 Encounter for screening for human immunodeficiency virus [HIV]: Secondary | ICD-10-CM | POA: Diagnosis not present

## 2020-01-07 DIAGNOSIS — Z20828 Contact with and (suspected) exposure to other viral communicable diseases: Secondary | ICD-10-CM | POA: Diagnosis not present

## 2020-01-28 DIAGNOSIS — M79671 Pain in right foot: Secondary | ICD-10-CM | POA: Diagnosis not present

## 2020-02-08 DIAGNOSIS — M79671 Pain in right foot: Secondary | ICD-10-CM | POA: Diagnosis not present

## 2020-02-08 DIAGNOSIS — M67969 Unspecified disorder of synovium and tendon, unspecified lower leg: Secondary | ICD-10-CM | POA: Diagnosis not present

## 2020-02-11 DIAGNOSIS — H401131 Primary open-angle glaucoma, bilateral, mild stage: Secondary | ICD-10-CM | POA: Diagnosis not present

## 2020-03-24 DIAGNOSIS — L503 Dermatographic urticaria: Secondary | ICD-10-CM | POA: Diagnosis not present

## 2020-03-24 DIAGNOSIS — J301 Allergic rhinitis due to pollen: Secondary | ICD-10-CM | POA: Diagnosis not present

## 2020-03-24 DIAGNOSIS — J3089 Other allergic rhinitis: Secondary | ICD-10-CM | POA: Diagnosis not present

## 2020-04-07 DIAGNOSIS — Z0000 Encounter for general adult medical examination without abnormal findings: Secondary | ICD-10-CM | POA: Diagnosis not present

## 2020-04-07 DIAGNOSIS — Z13220 Encounter for screening for lipoid disorders: Secondary | ICD-10-CM | POA: Diagnosis not present

## 2020-04-07 DIAGNOSIS — Z131 Encounter for screening for diabetes mellitus: Secondary | ICD-10-CM | POA: Diagnosis not present

## 2020-07-18 DIAGNOSIS — H401131 Primary open-angle glaucoma, bilateral, mild stage: Secondary | ICD-10-CM | POA: Diagnosis not present

## 2020-08-18 DIAGNOSIS — N760 Acute vaginitis: Secondary | ICD-10-CM | POA: Diagnosis not present

## 2020-08-18 DIAGNOSIS — R300 Dysuria: Secondary | ICD-10-CM | POA: Diagnosis not present

## 2020-08-31 DIAGNOSIS — Z20828 Contact with and (suspected) exposure to other viral communicable diseases: Secondary | ICD-10-CM | POA: Diagnosis not present

## 2020-10-27 DIAGNOSIS — Z118 Encounter for screening for other infectious and parasitic diseases: Secondary | ICD-10-CM | POA: Diagnosis not present

## 2020-10-27 DIAGNOSIS — Z113 Encounter for screening for infections with a predominantly sexual mode of transmission: Secondary | ICD-10-CM | POA: Diagnosis not present

## 2020-10-27 DIAGNOSIS — Z01419 Encounter for gynecological examination (general) (routine) without abnormal findings: Secondary | ICD-10-CM | POA: Diagnosis not present

## 2020-10-27 DIAGNOSIS — Z1159 Encounter for screening for other viral diseases: Secondary | ICD-10-CM | POA: Diagnosis not present

## 2020-10-27 DIAGNOSIS — Z114 Encounter for screening for human immunodeficiency virus [HIV]: Secondary | ICD-10-CM | POA: Diagnosis not present

## 2020-10-27 DIAGNOSIS — Z6841 Body Mass Index (BMI) 40.0 and over, adult: Secondary | ICD-10-CM | POA: Diagnosis not present

## 2020-11-13 DIAGNOSIS — R0981 Nasal congestion: Secondary | ICD-10-CM | POA: Diagnosis not present

## 2020-11-13 DIAGNOSIS — B373 Candidiasis of vulva and vagina: Secondary | ICD-10-CM | POA: Diagnosis not present

## 2021-01-05 DIAGNOSIS — J069 Acute upper respiratory infection, unspecified: Secondary | ICD-10-CM | POA: Diagnosis not present

## 2021-01-05 DIAGNOSIS — B974 Respiratory syncytial virus as the cause of diseases classified elsewhere: Secondary | ICD-10-CM | POA: Diagnosis not present

## 2021-01-05 DIAGNOSIS — Z03818 Encounter for observation for suspected exposure to other biological agents ruled out: Secondary | ICD-10-CM | POA: Diagnosis not present

## 2021-02-16 DIAGNOSIS — H401131 Primary open-angle glaucoma, bilateral, mild stage: Secondary | ICD-10-CM | POA: Diagnosis not present

## 2021-03-02 DIAGNOSIS — L2089 Other atopic dermatitis: Secondary | ICD-10-CM | POA: Diagnosis not present

## 2021-03-27 DIAGNOSIS — N898 Other specified noninflammatory disorders of vagina: Secondary | ICD-10-CM | POA: Diagnosis not present

## 2021-03-27 DIAGNOSIS — N760 Acute vaginitis: Secondary | ICD-10-CM | POA: Diagnosis not present

## 2021-04-01 DIAGNOSIS — J3089 Other allergic rhinitis: Secondary | ICD-10-CM | POA: Diagnosis not present

## 2021-04-01 DIAGNOSIS — J301 Allergic rhinitis due to pollen: Secondary | ICD-10-CM | POA: Diagnosis not present

## 2021-04-01 DIAGNOSIS — L503 Dermatographic urticaria: Secondary | ICD-10-CM | POA: Diagnosis not present

## 2021-04-08 DIAGNOSIS — N898 Other specified noninflammatory disorders of vagina: Secondary | ICD-10-CM | POA: Diagnosis not present

## 2021-04-08 DIAGNOSIS — N760 Acute vaginitis: Secondary | ICD-10-CM | POA: Diagnosis not present

## 2021-04-08 DIAGNOSIS — Z0000 Encounter for general adult medical examination without abnormal findings: Secondary | ICD-10-CM | POA: Diagnosis not present

## 2021-06-05 DIAGNOSIS — H401131 Primary open-angle glaucoma, bilateral, mild stage: Secondary | ICD-10-CM | POA: Diagnosis not present

## 2022-02-15 DIAGNOSIS — H401131 Primary open-angle glaucoma, bilateral, mild stage: Secondary | ICD-10-CM | POA: Diagnosis not present

## 2022-03-17 DIAGNOSIS — B078 Other viral warts: Secondary | ICD-10-CM | POA: Diagnosis not present

## 2022-03-17 DIAGNOSIS — L821 Other seborrheic keratosis: Secondary | ICD-10-CM | POA: Diagnosis not present

## 2022-04-14 DIAGNOSIS — J3089 Other allergic rhinitis: Secondary | ICD-10-CM | POA: Diagnosis not present

## 2022-04-14 DIAGNOSIS — J301 Allergic rhinitis due to pollen: Secondary | ICD-10-CM | POA: Diagnosis not present

## 2022-04-14 DIAGNOSIS — L503 Dermatographic urticaria: Secondary | ICD-10-CM | POA: Diagnosis not present

## 2022-04-27 DIAGNOSIS — Z13220 Encounter for screening for lipoid disorders: Secondary | ICD-10-CM | POA: Diagnosis not present

## 2022-04-27 DIAGNOSIS — Z131 Encounter for screening for diabetes mellitus: Secondary | ICD-10-CM | POA: Diagnosis not present

## 2022-05-04 DIAGNOSIS — Z0000 Encounter for general adult medical examination without abnormal findings: Secondary | ICD-10-CM | POA: Diagnosis not present

## 2022-05-12 DIAGNOSIS — J301 Allergic rhinitis due to pollen: Secondary | ICD-10-CM | POA: Diagnosis not present

## 2022-05-13 DIAGNOSIS — J3089 Other allergic rhinitis: Secondary | ICD-10-CM | POA: Diagnosis not present

## 2022-05-19 DIAGNOSIS — J3089 Other allergic rhinitis: Secondary | ICD-10-CM | POA: Diagnosis not present

## 2022-05-19 DIAGNOSIS — J301 Allergic rhinitis due to pollen: Secondary | ICD-10-CM | POA: Diagnosis not present

## 2022-05-26 DIAGNOSIS — J301 Allergic rhinitis due to pollen: Secondary | ICD-10-CM | POA: Diagnosis not present

## 2022-05-26 DIAGNOSIS — J3089 Other allergic rhinitis: Secondary | ICD-10-CM | POA: Diagnosis not present

## 2022-05-26 DIAGNOSIS — J3081 Allergic rhinitis due to animal (cat) (dog) hair and dander: Secondary | ICD-10-CM | POA: Diagnosis not present

## 2022-06-02 DIAGNOSIS — J3081 Allergic rhinitis due to animal (cat) (dog) hair and dander: Secondary | ICD-10-CM | POA: Diagnosis not present

## 2022-06-02 DIAGNOSIS — J301 Allergic rhinitis due to pollen: Secondary | ICD-10-CM | POA: Diagnosis not present

## 2022-06-02 DIAGNOSIS — J3089 Other allergic rhinitis: Secondary | ICD-10-CM | POA: Diagnosis not present

## 2022-06-14 DIAGNOSIS — H401131 Primary open-angle glaucoma, bilateral, mild stage: Secondary | ICD-10-CM | POA: Diagnosis not present

## 2022-06-16 DIAGNOSIS — J3081 Allergic rhinitis due to animal (cat) (dog) hair and dander: Secondary | ICD-10-CM | POA: Diagnosis not present

## 2022-06-16 DIAGNOSIS — J301 Allergic rhinitis due to pollen: Secondary | ICD-10-CM | POA: Diagnosis not present

## 2022-06-16 DIAGNOSIS — J3089 Other allergic rhinitis: Secondary | ICD-10-CM | POA: Diagnosis not present

## 2022-06-23 DIAGNOSIS — J3089 Other allergic rhinitis: Secondary | ICD-10-CM | POA: Diagnosis not present

## 2022-06-23 DIAGNOSIS — J301 Allergic rhinitis due to pollen: Secondary | ICD-10-CM | POA: Diagnosis not present

## 2022-06-23 DIAGNOSIS — J3081 Allergic rhinitis due to animal (cat) (dog) hair and dander: Secondary | ICD-10-CM | POA: Diagnosis not present

## 2022-07-01 DIAGNOSIS — J3081 Allergic rhinitis due to animal (cat) (dog) hair and dander: Secondary | ICD-10-CM | POA: Diagnosis not present

## 2022-07-01 DIAGNOSIS — J301 Allergic rhinitis due to pollen: Secondary | ICD-10-CM | POA: Diagnosis not present

## 2022-07-01 DIAGNOSIS — J3089 Other allergic rhinitis: Secondary | ICD-10-CM | POA: Diagnosis not present

## 2022-07-08 DIAGNOSIS — J301 Allergic rhinitis due to pollen: Secondary | ICD-10-CM | POA: Diagnosis not present

## 2022-07-08 DIAGNOSIS — J3089 Other allergic rhinitis: Secondary | ICD-10-CM | POA: Diagnosis not present

## 2022-07-08 DIAGNOSIS — J3081 Allergic rhinitis due to animal (cat) (dog) hair and dander: Secondary | ICD-10-CM | POA: Diagnosis not present

## 2022-08-17 DIAGNOSIS — K209 Esophagitis, unspecified without bleeding: Secondary | ICD-10-CM | POA: Diagnosis not present

## 2022-08-17 DIAGNOSIS — R1013 Epigastric pain: Secondary | ICD-10-CM | POA: Diagnosis not present

## 2022-08-17 DIAGNOSIS — R079 Chest pain, unspecified: Secondary | ICD-10-CM | POA: Diagnosis not present

## 2022-11-01 DIAGNOSIS — R1013 Epigastric pain: Secondary | ICD-10-CM | POA: Diagnosis not present

## 2022-11-01 DIAGNOSIS — K219 Gastro-esophageal reflux disease without esophagitis: Secondary | ICD-10-CM | POA: Diagnosis not present

## 2022-12-22 DIAGNOSIS — Z1159 Encounter for screening for other viral diseases: Secondary | ICD-10-CM | POA: Diagnosis not present

## 2022-12-22 DIAGNOSIS — Z6841 Body Mass Index (BMI) 40.0 and over, adult: Secondary | ICD-10-CM | POA: Diagnosis not present

## 2022-12-22 DIAGNOSIS — Z113 Encounter for screening for infections with a predominantly sexual mode of transmission: Secondary | ICD-10-CM | POA: Diagnosis not present

## 2022-12-22 DIAGNOSIS — Z114 Encounter for screening for human immunodeficiency virus [HIV]: Secondary | ICD-10-CM | POA: Diagnosis not present

## 2022-12-22 DIAGNOSIS — Z01419 Encounter for gynecological examination (general) (routine) without abnormal findings: Secondary | ICD-10-CM | POA: Diagnosis not present

## 2022-12-22 DIAGNOSIS — Z118 Encounter for screening for other infectious and parasitic diseases: Secondary | ICD-10-CM | POA: Diagnosis not present

## 2023-01-10 DIAGNOSIS — N946 Dysmenorrhea, unspecified: Secondary | ICD-10-CM | POA: Diagnosis not present

## 2023-01-10 DIAGNOSIS — D259 Leiomyoma of uterus, unspecified: Secondary | ICD-10-CM | POA: Diagnosis not present

## 2023-02-14 DIAGNOSIS — H401131 Primary open-angle glaucoma, bilateral, mild stage: Secondary | ICD-10-CM | POA: Diagnosis not present

## 2023-02-23 DIAGNOSIS — R519 Headache, unspecified: Secondary | ICD-10-CM | POA: Diagnosis not present

## 2023-03-09 DIAGNOSIS — R030 Elevated blood-pressure reading, without diagnosis of hypertension: Secondary | ICD-10-CM | POA: Diagnosis not present

## 2023-03-09 DIAGNOSIS — R519 Headache, unspecified: Secondary | ICD-10-CM | POA: Diagnosis not present

## 2023-03-17 DIAGNOSIS — R519 Headache, unspecified: Secondary | ICD-10-CM | POA: Diagnosis not present

## 2023-03-17 DIAGNOSIS — R030 Elevated blood-pressure reading, without diagnosis of hypertension: Secondary | ICD-10-CM | POA: Diagnosis not present

## 2023-04-07 DIAGNOSIS — R1013 Epigastric pain: Secondary | ICD-10-CM | POA: Diagnosis not present

## 2023-04-07 DIAGNOSIS — K219 Gastro-esophageal reflux disease without esophagitis: Secondary | ICD-10-CM | POA: Diagnosis not present

## 2023-05-10 DIAGNOSIS — R030 Elevated blood-pressure reading, without diagnosis of hypertension: Secondary | ICD-10-CM | POA: Diagnosis not present

## 2023-05-10 DIAGNOSIS — Z13220 Encounter for screening for lipoid disorders: Secondary | ICD-10-CM | POA: Diagnosis not present

## 2023-05-10 DIAGNOSIS — K219 Gastro-esophageal reflux disease without esophagitis: Secondary | ICD-10-CM | POA: Diagnosis not present

## 2023-05-10 DIAGNOSIS — Z0000 Encounter for general adult medical examination without abnormal findings: Secondary | ICD-10-CM | POA: Diagnosis not present

## 2023-06-15 DIAGNOSIS — H401131 Primary open-angle glaucoma, bilateral, mild stage: Secondary | ICD-10-CM | POA: Diagnosis not present

## 2023-08-03 DIAGNOSIS — M722 Plantar fascial fibromatosis: Secondary | ICD-10-CM | POA: Diagnosis not present

## 2023-09-13 DIAGNOSIS — M76821 Posterior tibial tendinitis, right leg: Secondary | ICD-10-CM | POA: Diagnosis not present

## 2023-09-13 DIAGNOSIS — M25571 Pain in right ankle and joints of right foot: Secondary | ICD-10-CM | POA: Diagnosis not present

## 2023-10-13 DIAGNOSIS — M76821 Posterior tibial tendinitis, right leg: Secondary | ICD-10-CM | POA: Diagnosis not present

## 2024-03-24 ENCOUNTER — Other Ambulatory Visit: Payer: Self-pay

## 2024-03-24 ENCOUNTER — Emergency Department (HOSPITAL_BASED_OUTPATIENT_CLINIC_OR_DEPARTMENT_OTHER)

## 2024-03-24 ENCOUNTER — Emergency Department (HOSPITAL_COMMUNITY)
Admission: EM | Admit: 2024-03-24 | Discharge: 2024-03-24 | Disposition: A | Discharge status: Home / Self Care | Attending: Emergency Medicine | Admitting: Emergency Medicine

## 2024-03-24 ENCOUNTER — Encounter (HOSPITAL_COMMUNITY): Payer: Self-pay

## 2024-03-24 DIAGNOSIS — M79662 Pain in left lower leg: Secondary | ICD-10-CM | POA: Insufficient documentation

## 2024-03-24 DIAGNOSIS — M79605 Pain in left leg: Secondary | ICD-10-CM

## 2024-03-24 MED ORDER — METHOCARBAMOL 500 MG PO TABS: 500.0000 mg | ORAL_TABLET | Freq: Two times a day (BID) | ORAL | 0 refills | Status: AC

## 2024-03-24 MED ORDER — NAPROXEN 500 MG PO TABS: 500.0000 mg | ORAL_TABLET | Freq: Two times a day (BID) | ORAL | 0 refills | Status: AC

## 2024-03-24 NOTE — ED Provider Notes (Signed)
 [place] [facility] AT [facility] Provider Note   CSN: [id number] Arrival date & time: 03/24/24  1724     History  Chief Complaint  Patient presents with   Leg Pain    Bridget Thomas is a 38 y.o. female with past medical history of BMI of 43 presents to emergency department for evaluation of LLE pain.  She reports that she woke up today with pain to her left upper thigh that has now radiated into left calf.  She endorses 1 episode of LLE pain 2 weeks ago but went away on its own.  She is on her feet all day at work and does not wear compression socks.  She is on the same OCP for the past year.  She denies chest pain, shortness of breath, hemoptysis, history of clots, recent surgery.   Leg Pain Associated symptoms: no fatigue and no fever       Home Medications Prior to Admission medications   Medication Sig Start Date End Date Taking? Authorizing Provider  methocarbamol (ROBAXIN) 500 MG tablet Take 1 tablet (500 mg total) by mouth 2 (two) times daily. 03/24/24  Yes Judithann Sheen, PA  naproxen (NAPROSYN) 500 MG tablet Take 1 tablet (500 mg total) by mouth 2 (two) times daily. 03/24/24  Yes Judithann Sheen, PA  acetaminophen (TYLENOL) 325 MG tablet Take 650 mg by mouth every 6 (six) hours as needed.    [provider]  benzonatate (TESSALON) 100 MG capsule Take 1-2 capsules (100-200 mg total) by mouth 3 (three) times daily as needed for cough. 01/30/18   Trena Platt D, PA  fluticasone (FLONASE) 50 MCG/ACT nasal spray Place 2 sprays into both nostrils daily. 09/23/17   Lezlie Lye, Meda Coffee, MD  HYDROcodone-homatropine [facility]) 5-1.5 MG/5ML syrup Take 5 mLs by mouth every 12 (twelve) hours as needed for cough. 01/30/18   Trena Platt D, PA  ipratropium (ATROVENT) 0.03 % nasal spray PLACE 2 SPRAYS INTO BOTH NOSTRILS 2 (TWO) TIMES DAILY. 02/27/18   Trena Platt D, PA  Norethindrone-Ethinyl Estradiol-Fe Biphas (LO LOESTRIN FE) 1 MG-10 MCG /  10 MCG tablet Take 1 tablet by mouth daily.    [provider]  timolol (TIMOPTIC) 0.5 % ophthalmic solution Place 1 drop into both eyes every morning. 09/07/17   [provider]      Allergies    Dust mite extract and Grass pollen([id number] vern)    Review of Systems   Review of Systems  Constitutional:  Negative for chills, fatigue and fever.  Respiratory:  Negative for cough, chest tightness, shortness of breath and wheezing.   Cardiovascular:  Negative for chest pain and palpitations.  Gastrointestinal:  Negative for abdominal pain, constipation, diarrhea, nausea and vomiting.  Neurological:  Negative for dizziness, seizures, weakness, light-headedness, numbness and headaches.    Physical Exam Updated Vital Signs BP (!) 145/97   Pulse 97   Temp 98.2 F (36.8 C)   Resp 18   Ht 5\' 3"  (1.6 m)   Wt 111.6 kg   SpO2 100%   BMI 43.58 kg/m  Physical Exam Vitals and nursing note reviewed.  Constitutional:      General: She is not in acute distress.    Appearance: Normal appearance.  HENT:     Head: Normocephalic and atraumatic.  Eyes:     Conjunctiva/sclera: Conjunctivae normal.  Cardiovascular:     Rate and Rhythm: Normal rate.  Pulmonary:     Effort: Pulmonary effort is normal.  No respiratory distress.  Musculoskeletal:     Right lower leg: No edema.     Left lower leg: No edema.     Comments: TTP of left calf. Notes some "tightness" with dorsiflexion of left ankle. Well perfused limb. No swelling to BLE. No overlying skin changes nor infectious appearing skin.  Motor 5/5 of BLE.  Sensation 2/2 of BLE.  Skin:    Coloration: Skin is not jaundiced or pale.  Neurological:     Mental Status: She is alert. Mental status is at baseline.     ED Results / Procedures / Treatments   Labs (all labs ordered are listed, but only abnormal results are displayed) Labs Reviewed - No data to display  EKG None  Radiology VAS [place] LOWER EXTREMITY VENOUS (DVT)  (ONLY [facility] & [facility]) Result Date: 03/24/2024  Lower Venous DVT Study Patient Name:  Bridget Thomas  Date of Exam:   03/24/2024 Medical Rec #: [id number]         Accession #:    [id number] Date of Birth: 29-May-1986         Patient Gender: F Patient Age:   89 years Exam Location:  [facility] Procedure:      VAS [place] LOWER EXTREMITY VENOUS (DVT) Referring Phys: MICHAEL BUTLER --------------------------------------------------------------------------------  Indications: Posterior thigh pain and tightness into the posterior calf.  Limitations: Pain with compression maneuvers. Comparison Study: No prior study on file Performing Technologist: Sherren Kerns RVS  Examination Guidelines: A complete evaluation includes B-mode imaging, spectral Doppler, color Doppler, and power Doppler as needed of all accessible portions of each vessel. Bilateral testing is considered an integral part of a complete examination. Limited examinations for reoccurring indications may be performed as noted. The reflux portion of the exam is performed with the patient in reverse Trendelenburg.  +-----+---------------+---------+-----------+----------+--------------+ RIGHTCompressibilityPhasicitySpontaneityPropertiesThrombus Aging +-----+---------------+---------+-----------+----------+--------------+ CFV  Full           Yes      Yes                                 +-----+---------------+---------+-----------+----------+--------------+   +---------+---------------+---------+-----------+----------+-------------------+ LEFT     CompressibilityPhasicitySpontaneityPropertiesThrombus Aging      +---------+---------------+---------+-----------+----------+-------------------+ CFV      Full           Yes      Yes                                      +---------+---------------+---------+-----------+----------+-------------------+ SFJ      Full                                                              +---------+---------------+---------+-----------+----------+-------------------+ FV Prox  Full                                                             +---------+---------------+---------+-----------+----------+-------------------+ FV Mid  Yes      Yes                  patent by color and                                                       Doppler             +---------+---------------+---------+-----------+----------+-------------------+ FV Distal               Yes      Yes                  patent by color and                                                       Doppler             +---------+---------------+---------+-----------+----------+-------------------+ PFV      Full                                                             +---------+---------------+---------+-----------+----------+-------------------+ POP      Full           Yes      Yes                                      +---------+---------------+---------+-----------+----------+-------------------+ PTV      Full                                                             +---------+---------------+---------+-----------+----------+-------------------+ PERO     Full                                                             +---------+---------------+---------+-----------+----------+-------------------+ GSV      Full                                                             +---------+---------------+---------+-----------+----------+-------------------+   Summary: RIGHT: - No evidence of common femoral vein obstruction.   LEFT: - There is no evidence of deep vein thrombosis in the lower extremity.  - No cystic structure found in the popliteal fossa.  *See table(s) above for measurements and observations.    Preliminary     Procedures Procedures  Medications Ordered in ED Medications - No data to display  ED Course/ Medical Decision Making/  A&P                                 Medical Decision Making Risk Prescription drug management.   Patient presents to the ED for concern of LLE pain, this involves an extensive number of treatment options, and is a complaint that carries with it a high risk of complications and morbidity.  The differential diagnosis includes VTE, cellulitis, compartment syndrome, Baker's cyst, superficial thrombophlebitis, fracture   Co morbidities that complicate the patient evaluation  See HPI   Additional history obtained:  Additional history obtained from Nursing and Outside Medical Records   External records from outside source obtained and reviewed including triage RN note    Imaging Studies ordered:  I ordered imaging studies including DVT ultrasound I independently visualized and interpreted imaging which showed no DVT nor Baker's cyst I agree with the radiologist interpretation    Medicines ordered and prescription drug management:  I ordered medication including naproxen and Robaxin for muscle strain and pain I have reviewed the patients home medicines and have made adjustments as needed     Problem List / ED Course:  LLE pain Tenderness to palpation of left calf.  With reassuring DVT study and patient being active at work standing all day, pain could be secondary to muscle strain.  Will provide naproxen and Robaxin for symptoms.  Patient has PCP appointment next month and will follow-up. Low suspicion for fluid overload as patient does not have significant edema to BLE Low suspicion for PE as patient has no complaints of chest pain, shortness of breath Low suspicion for cellulitis nor superficial thrombophlebitis.  No overlying skin changes to indicate infection Low suspicion for compartment syndrome or ischemic limb as limb appears well-perfused.  No pulselessness nor pallor. Low suspicion for fracture as patient has no traumatic etiology   Reevaluation:  After the  interventions noted above, I reevaluated the patient and found that they have :stayed the same   Social Determinants of Health:  Has PCP   Dispostion:  After consideration of the diagnostic results and the patients response to treatment, I feel that the patent would benefit from patient management with symptomatic care.   Discussed ED workup, disposition, return to ED precautions with patient who expresses understanding agrees with plan.  All questions answered to their satisfaction.  They are agreeable to plan.  Discharge instructions provided on paperwork  Final Clinical Impression(s) / ED Diagnoses Final diagnoses:  Left leg pain    Rx / DC Orders ED Discharge Orders          Ordered    naproxen (NAPROSYN) 500 MG tablet  2 times daily        03/24/24 2037    methocarbamol (ROBAXIN) 500 MG tablet  2 times daily        03/24/24 2037              Judithann Sheen, PA 03/24/24 2317    Terald Sleeper, MD 03/25/24 1335

## 2024-03-24 NOTE — Progress Notes (Signed)
 VASCULAR LAB    Left lower extremity venous duplex has been performed.  See CV proc for preliminary results.   Jrake Rodriquez, RVT 03/24/2024, 8:24 PM

## 2024-03-24 NOTE — Discharge Instructions (Addendum)
 Thank for letting evaluate you today.  Your ultrasound was negative for a clot in your leg.  Therefore, it is likely due to a muscle strain or spasm.  I have sent naproxen and Robaxin to your pharmacy. You may use naproxen and Tylenol intermittently every 8 hours as needed for pain.  Please do not use naproxen with aspirin, Aleve, ibuprofen, Advil as they are all in the same family. Robaxin may cause drowsiness so do not operate heavy machinery including driving or drink alcohol with this.  You may take this at night or split the tablet in half if it makes you too drowsy.  You may also use over-the-counter Voltaren gel or lidocaine patches in areas of pain.  I would start wearing compression socks to help with swelling throughout the day. Please make sure to rest and elevate legs at night  Return to emergency department for experiencing acute worsening symptoms, chest pain, shortness of breath

## 2024-03-24 NOTE — ED Notes (Signed)
 [place] tech doing exam now.

## 2024-03-24 NOTE — ED Triage Notes (Signed)
 Pt reports L thigh/calf pain since this am. UC sent pt here for DVT rule out
